# Patient Record
Sex: Female | Born: 1980 | Race: Black or African American | Hispanic: No | Marital: Single | Smoking: Former smoker
Health system: Southern US, Community
[De-identification: ages and names within clinical notes are randomized; demographics above are authoritative.]

## PROBLEM LIST (undated history)

## (undated) DIAGNOSIS — B169 Acute hepatitis B without delta-agent and without hepatic coma: Secondary | ICD-10-CM

## (undated) HISTORY — PX: OTHER SURGICAL HISTORY: SHX169

---

## 2002-09-25 ENCOUNTER — Other Ambulatory Visit: Admission: RE | Admit: 2002-09-25 | Discharge: 2002-09-25 | Payer: Self-pay | Admitting: *Deleted

## 2002-12-12 ENCOUNTER — Inpatient Hospital Stay (HOSPITAL_COMMUNITY): Admission: AD | Admit: 2002-12-12 | Discharge: 2002-12-15 | Payer: Self-pay | Admitting: *Deleted

## 2012-05-22 ENCOUNTER — Encounter (HOSPITAL_COMMUNITY): Payer: Self-pay | Admitting: Physical Medicine and Rehabilitation

## 2012-05-22 ENCOUNTER — Observation Stay (HOSPITAL_COMMUNITY): Payer: Medicaid Other

## 2012-05-22 ENCOUNTER — Encounter (HOSPITAL_COMMUNITY): Payer: Self-pay | Admitting: *Deleted

## 2012-05-22 ENCOUNTER — Inpatient Hospital Stay (HOSPITAL_COMMUNITY)
Admission: EM | Admit: 2012-05-22 | Discharge: 2012-05-24 | DRG: 443 | Disposition: A | Discharge status: Home / Self Care | Payer: Medicaid Other | Attending: Internal Medicine | Admitting: Internal Medicine

## 2012-05-22 ENCOUNTER — Emergency Department (INDEPENDENT_AMBULATORY_CARE_PROVIDER_SITE_OTHER)
Admission: EM | Admit: 2012-05-22 | Discharge: 2012-05-22 | Disposition: A | Discharge status: Nursing Home (Includes ICF) | Payer: Self-pay | Source: Home / Self Care | Attending: Family Medicine | Admitting: Family Medicine

## 2012-05-22 DIAGNOSIS — E876 Hypokalemia: Secondary | ICD-10-CM

## 2012-05-22 DIAGNOSIS — R17 Unspecified jaundice: Secondary | ICD-10-CM | POA: Diagnosis present

## 2012-05-22 DIAGNOSIS — D692 Other nonthrombocytopenic purpura: Secondary | ICD-10-CM

## 2012-05-22 DIAGNOSIS — R7401 Elevation of levels of liver transaminase levels: Secondary | ICD-10-CM | POA: Diagnosis present

## 2012-05-22 DIAGNOSIS — R748 Abnormal levels of other serum enzymes: Secondary | ICD-10-CM

## 2012-05-22 DIAGNOSIS — F17200 Nicotine dependence, unspecified, uncomplicated: Secondary | ICD-10-CM | POA: Diagnosis present

## 2012-05-22 DIAGNOSIS — E669 Obesity, unspecified: Secondary | ICD-10-CM | POA: Diagnosis present

## 2012-05-22 DIAGNOSIS — B1910 Unspecified viral hepatitis B without hepatic coma: Principal | ICD-10-CM | POA: Diagnosis present

## 2012-05-22 DIAGNOSIS — B169 Acute hepatitis B without delta-agent and without hepatic coma: Secondary | ICD-10-CM | POA: Diagnosis present

## 2012-05-22 DIAGNOSIS — K7689 Other specified diseases of liver: Secondary | ICD-10-CM | POA: Diagnosis present

## 2012-05-22 HISTORY — DX: Acute hepatitis B without delta-agent and without hepatic coma: B16.9

## 2012-05-22 LAB — URINALYSIS, ROUTINE W REFLEX MICROSCOPIC
Glucose, UA: NEGATIVE mg/dL
Hgb urine dipstick: NEGATIVE
Ketones, ur: NEGATIVE mg/dL
Leukocytes, UA: NEGATIVE
Nitrite: NEGATIVE
Protein, ur: NEGATIVE mg/dL
Specific Gravity, Urine: 1.003 — ABNORMAL LOW (ref 1.005–1.030)
Urobilinogen, UA: 0.2 mg/dL (ref 0.0–1.0)
pH: 6 (ref 5.0–8.0)

## 2012-05-22 LAB — COMPREHENSIVE METABOLIC PANEL
Albumin: 3.4 g/dL — ABNORMAL LOW (ref 3.5–5.2)
Alkaline Phosphatase: 213 U/L — ABNORMAL HIGH (ref 39–117)
BUN: 7 mg/dL (ref 6–23)
Chloride: 96 mEq/L (ref 96–112)
Glucose, Bld: 95 mg/dL (ref 70–99)
Potassium: 2.8 mEq/L — ABNORMAL LOW (ref 3.5–5.1)
Total Bilirubin: 14.8 mg/dL — ABNORMAL HIGH (ref 0.3–1.2)

## 2012-05-22 LAB — POCT URINALYSIS DIP (DEVICE)
Glucose, UA: 100 mg/dL — AB
Ketones, ur: NEGATIVE mg/dL
Specific Gravity, Urine: <=1.005 (ref 1.005–1.030)

## 2012-05-22 LAB — CBC WITH DIFFERENTIAL/PLATELET
Basophils Relative: 2 % — ABNORMAL HIGH (ref 0–1)
Hemoglobin: 13.2 g/dL (ref 12.0–15.0)
Lymphocytes Relative: 26 % (ref 12–46)
Lymphs Abs: 2 10*3/uL (ref 0.7–4.0)
Monocytes Relative: 7 % (ref 3–12)
Neutro Abs: 4.5 10*3/uL (ref 1.7–7.7)
Neutrophils Relative %: 59 % (ref 43–77)
RBC: 4.56 MIL/uL (ref 3.87–5.11)
WBC: 7.6 10*3/uL (ref 4.0–10.5)

## 2012-05-22 LAB — HEPATIC FUNCTION PANEL
Alkaline Phosphatase: 194 U/L — ABNORMAL HIGH (ref 39–117)
Indirect Bilirubin: 3.3 mg/dL — ABNORMAL HIGH (ref 0.3–0.9)
Total Bilirubin: 14.7 mg/dL — ABNORMAL HIGH (ref 0.3–1.2)

## 2012-05-22 LAB — POCT PREGNANCY, URINE: Preg Test, Ur: NEGATIVE

## 2012-05-22 LAB — CBC
MCH: 28.8 pg (ref 26.0–34.0)
MCHC: 36.3 g/dL — ABNORMAL HIGH (ref 30.0–36.0)
Platelets: 283 10*3/uL (ref 150–400)
RDW: 14 % (ref 11.5–15.5)

## 2012-05-22 LAB — PROTIME-INR: Prothrombin Time: 14.7 seconds (ref 11.6–15.2)

## 2012-05-22 LAB — CREATININE, SERUM: Creatinine, Ser: 0.74 mg/dL (ref 0.50–1.10)

## 2012-05-22 LAB — LIPASE, BLOOD: Lipase: 61 U/L — ABNORMAL HIGH (ref 11–59)

## 2012-05-22 MED ORDER — IBUPROFEN 400 MG PO TABS
400.0000 mg | ORAL_TABLET | Freq: Four times a day (QID) | ORAL | Status: DC | PRN
  Filled 2012-05-22: qty 1

## 2012-05-22 MED ORDER — SODIUM CHLORIDE 0.9 % IV SOLN: INTRAVENOUS | Status: AC

## 2012-05-22 MED ORDER — BISACODYL 5 MG PO TBEC: 10.0000 mg | DELAYED_RELEASE_TABLET | Freq: Every day | ORAL | Status: DC | PRN

## 2012-05-22 MED ORDER — SENNOSIDES-DOCUSATE SODIUM 8.6-50 MG PO TABS: 1.0000 | ORAL_TABLET | Freq: Every evening | ORAL | Status: DC | PRN

## 2012-05-22 MED ORDER — POTASSIUM CHLORIDE CRYS ER 20 MEQ PO TBCR
40.0000 meq | EXTENDED_RELEASE_TABLET | Freq: Once | ORAL | Status: AC
  Administered 2012-05-22: 40 meq via ORAL
  Filled 2012-05-22: qty 2

## 2012-05-22 MED ORDER — ALUM & MAG HYDROXIDE-SIMETH 200-200-20 MG/5ML PO SUSP: 30.0000 mL | Freq: Four times a day (QID) | ORAL | Status: DC | PRN

## 2012-05-22 MED ORDER — ENOXAPARIN SODIUM 40 MG/0.4ML ~~LOC~~ SOLN
40.0000 mg | SUBCUTANEOUS | Status: DC
  Administered 2012-05-22 – 2012-05-23 (×2): 40 mg via SUBCUTANEOUS
  Filled 2012-05-22 (×3): qty 0.4

## 2012-05-22 MED ORDER — SODIUM CHLORIDE 0.9 % IV SOLN
INTRAVENOUS | Status: AC
  Administered 2012-05-22: 23:00:00 via INTRAVENOUS

## 2012-05-22 MED ORDER — ONDANSETRON HCL 4 MG/2ML IJ SOLN: 4.0000 mg | Freq: Three times a day (TID) | INTRAMUSCULAR | Status: AC | PRN

## 2012-05-22 MED ORDER — ONDANSETRON HCL 4 MG PO TABS: 4.0000 mg | ORAL_TABLET | Freq: Four times a day (QID) | ORAL | Status: DC | PRN

## 2012-05-22 MED ORDER — ONDANSETRON HCL 4 MG/2ML IJ SOLN: 4.0000 mg | Freq: Four times a day (QID) | INTRAMUSCULAR | Status: DC | PRN

## 2012-05-22 NOTE — ED Notes (Signed)
Pt returned from ultrasound

## 2012-05-22 NOTE — H&P (Signed)
[facility] History and Physical  Robin Marks WUJ:[id number] DOB: February 26, 1981 DOA: 05/22/2012  Referring physician: Dr Radford Pax PCP: No primary provider on file.  Specialists: GI pending, [place],  Chief Complaint: Yellow eyes, dark urine  HPI: Robin Marks is a 31 y.o. female with no significant PMHx who presents to ED with a tree day history of yellow eyes and dark urine. Patient states that approximately 9 days prior to admission felt she was bitten by a spider on her left upper extremity which has since resolved and on the lips. Patient stated that 3 days prior to admission noticed that her eyes were yellow in the urine was dark the patient denied any itching no swelling. Patient denies any fevers, no chest pain, no shortness of breath, no abdominal pain, no emesis, no diarrhea, no constipation. Patient does endorse some chills some nausea and generalized weakness as well as decreased appetite. Patient denies any IV drug use. Patient denies any alcohol dependence. Patient denies any ingestion of Tylenol. Patient states use ibuprofen as needed. Patient is sexually active with men and states she practices safe sex. Patient initially had presented to the urgent care Center and was noted to be jaundiced and sent to the ED. In the ED comprehensive metabolic profile which was obtained came back with a transaminitis with a total bilirubin of 14.8. AST of 07/12/2003, ALT of 08/11/1999, lipase of 61. Magnesium of 1.9. Potassium of 2.8. Sodium of 134. CBC which was obtained at a white count of 7.6 hemoglobin of 13.2 platelet of 283. Urinalysis which was done showed large bilirubin in the urine. EKG showed a normal sinus rhythm. Urine pregnancy test was negative. Abdominal ultrasound is pending. Will call to admit the patient for further evaluation and management.  Review of Systems: The patient denies anorexia, fever, weight loss,, vision loss, decreased hearing, hoarseness, chest pain, syncope,  dyspnea on exertion, peripheral edema, balance deficits, hemoptysis, abdominal pain, melena, hematochezia, severe indigestion/heartburn, hematuria, incontinence, genital sores, muscle weakness, suspicious skin lesions, transient blindness, difficulty walking, depression, unusual weight change, abnormal bleeding, enlarged lymph nodes, angioedema, and breast masses.   History reviewed. No pertinent past medical history. Past Surgical History  Procedure Date  . Cesarean section    Social History:  reports that she has been smoking Cigars.  She does not have any smokeless tobacco history on file. She reports that she drinks alcohol. She reports that she does not use illicit drugs.  No Known Allergies  History reviewed. No pertinent family history.   Prior to Admission medications   Medication Sig Start Date End Date Taking? Authorizing Provider  ibuprofen (ADVIL,MOTRIN) 200 MG tablet Take 200 mg by mouth every 6 (six) hours as needed. For pain   Yes Historical Provider, MD   Physical Exam: Filed Vitals:   05/22/12 1611 05/22/12 1658 05/22/12 1759  BP: 119/87 115/76 129/92  Pulse: 91 95 98  Temp: 98.9 F (37.2 C)    TempSrc: Oral    Resp: 20 16 20   SpO2: 100% 100% 98%     General:  Well-developed well-nourished in no acute cardiopulmonary distress.  Eyes: Pupils equal round and reactive to light and accommodation. Extraocular movements intact. Sclera is icteric.  ENT: Oropharynx is clear, no lesions, no exudates.  Neck: Supple with no lymphadenopathy.  Cardiovascular: Regular rate rhythm no murmurs rubs or gallops. No JVD. No lower extremity edema.  Respiratory: Clear to sedation bilaterally. No wheezes, no crackles, no rhonchi.  Abdomen: Soft, nontender, nondistended, positive bowel sounds.  Skin: No lesions, no rashes  Musculoskeletal: 5 out of 5 bilateral upper extremity strength. 5 bilateral lower extremity strength.  Psychiatric: Normal mood. Normal affect. Good  insight. Good Judgment.  Neurologic: Alert and oriented x3. CN 2 through 12 are grossly intact. No focal deficits.  Labs on Admission:  Basic Metabolic Panel:  Lab 05/22/12 9604  NA 134*  K 2.8*  CL 96  CO2 27  GLUCOSE 95  BUN 7  CREATININE 0.78  CALCIUM 9.2  MG 1.9  PHOS --   Liver Function Tests:  Lab 05/22/12 1604  AST 1905*  ALT 2801*  ALKPHOS 213*  BILITOT 14.8*  PROT 7.8  ALBUMIN 3.4*    Lab 05/22/12 1604  LIPASE 61*  AMYLASE --   No results found for this basename: AMMONIA:5 in the last 168 hours CBC:  Lab 05/22/12 1604  WBC 7.6  NEUTROABS 4.5  HGB 13.2  HCT 35.9*  MCV 78.7  PLT 283   Cardiac Enzymes: No results found for this basename: CKTOTAL:5,CKMB:5,CKMBINDEX:5,TROPONINI:5 in the last 168 hours  BNP (last 3 results) No results found for this basename: PROBNP:3 in the last 8760 hours CBG: No results found for this basename: GLUCAP:5 in the last 168 hours  Radiological Exams on Admission: US Abdomen Complete  05/22/2012  *RADIOLOGY REPORT*  Clinical Data:  Elevated liver enzymes.  ABDOMINAL ULTRASOUND COMPLETE  Comparison:  None.  Findings:  Gallbladder:  No gallstones, gallbladder wall thickening, or pericholecystic fluid.  Common Bile Duct:  Within normal limits in caliber.  Liver: No focal mass.  Diffusely increased in echogenicity most compatible with diffuse hepatic steatosis.  IVC:  Appears normal.  Pancreas:  Obscured by overlying bowel gas.  Spleen:  Within normal limits in size and echotexture.  Right kidney:  No hydronephrosis or mass.  Normal in size and echogenicity.  Benign appearing 1.9 cm cyst in the upper pole.  Left kidney:  Normal in size and parenchymal echogenicity.  No evidence of mass or hydronephrosis.  Abdominal Aorta:  No aneurysm identified.  IMPRESSION: Increased echogenicity throughout the liver most consistent with diffuse hepatic steatosis.  Simple right renal cyst.  Pancreas was obscured by overlying bowel gas.   Original  Report Authenticated By: Jolaine Click, M.D.     EKG: NSR  Assessment/Plan Principal Problem:  *Jaundice Active Problems:  Transaminitis  Hypokalemia   #1 Painless jaundice Questionable etiology. Differential includes an acute viral hepatitis versus an autoimmune hepatitis versus primary biliary cirrhosis versus primary sclerosing cholangitis however patient with no pruritus or itching. Patient denies any Tylenol ingestion. Patient with no history of alcohol abuse. Patient with no recent new medications. Abdominal ultrasound with increased echogenicity throughout the liver consistent with diffuse hepatic steatosis. Pancreas is obscured. Common bile duct was within normal limits. Will admit the patient to MedSurg floor. Check an acute hepatitis panel. Check total IgG, ANA, antimitochondrial antibody, anti-smooth antibody, HIV, PT/INR. We'll consult with gastroenterology for further evaluation and management. Spoke with Dr. Rhea Belton on the telephone.  #2 transaminitis Questionable etiology. See problem #1.  #3 hypokalemia Replete.  #4 prophylaxis Lovenox for DVT prophylaxis.   Code Status: Full Family Communication: Updated patient. No family at bedside. Disposition Plan: Admit to inpatient.  Time spent: 60 mins  [facility] Pager [phone]  If 7PM-7AM, please contact night-coverage [email or web address] Password [email or web address] 05/22/2012, 8:22 PM

## 2012-05-22 NOTE — ED Provider Notes (Signed)
History     CSN: [id number]  Arrival date & time 05/22/12  1129   First MD Initiated Contact with Patient 05/22/12 1248      Chief Complaint  Patient presents with  . Urinary Frequency    (Consider location/radiation/quality/duration/timing/severity/associated sxs/prior treatment) HPI Comments: 31 year old female with history of low hemoglobin as per her report. Here complaining of yellow discoloration of sclerae, dark-colored urine for 4 days. Symptoms associated with subjective fever, nausea and mild abdominal discomfort, general malaise and skin rash. Denies vomiting. Patient reports normal stools. Patient works in a daycare. No prior history of hepatitis. No history of pancreatitis or recent alcohol intake. No known sick contacts. Patient also noted a red patch in the skin of her left inner arm few days ago and she thinks that she was bitten by a spider or an insect. Has had red spots in her anterior torso.   History reviewed. No pertinent past medical history.  Past Surgical History  Procedure Date  . Cesarean section     No family history on file.  History  Substance Use Topics  . Smoking status: Current Every Day Smoker  . Smokeless tobacco: Not on file  . Alcohol Use: Yes    OB History    Grav Para Term Preterm Abortions TAB SAB Ect Mult Living                  Review of Systems  Constitutional: Positive for fever, activity change, appetite change and fatigue. Negative for chills and diaphoresis.  HENT: Negative for congestion and sore throat.   Eyes: Negative for visual disturbance.  Gastrointestinal: Positive for nausea and abdominal pain. Negative for vomiting, diarrhea, constipation and abdominal distention.  Skin: Positive for rash.  Neurological: Negative for dizziness and headaches.  All other systems reviewed and are negative.    Allergies  Review of patient's allergies indicates not on file.  Home Medications  No current outpatient  prescriptions on file.  BP 129/89  Pulse 78  Temp 99 F (37.2 C) (Oral)  Resp 18  SpO2 97%  Physical Exam  Nursing note and vitals reviewed. Constitutional: She is oriented to person, place, and time. She appears well-developed and well-nourished. No distress.  HENT:  Head: Normocephalic and atraumatic.  Mouth/Throat: Oropharynx is clear and moist. No oropharyngeal exudate.       Sublingual mucosal jaundice  Eyes: Conjunctivae normal and EOM are normal. Pupils are equal, round, and reactive to light. Right eye exhibits no discharge. Left eye exhibits no discharge. Scleral icterus is present.       Mild to moderate bilateral scleral jaundice.  Neck: Neck supple. No thyromegaly present.  Cardiovascular: Normal rate, regular rhythm and normal heart sounds.  Exam reveals no gallop and no friction rub.   No murmur heard. Pulmonary/Chest: Effort normal and breath sounds normal. No respiratory distress. She has no wheezes. She has no rales. She exhibits no tenderness.  Abdominal: Soft. Bowel sounds are normal. She exhibits no distension and no mass. There is no rebound and no guarding.       Mild epigastric tenderness.  No hepatosplenomegaly.  Lymphadenopathy:    She has no cervical adenopathy.  Neurological: She is alert and oriented to person, place, and time.  Skin: She is not diaphoretic.       Purpuric petechial rash in the anterior upper torso    ED Course  Procedures (including critical care time)  Labs Reviewed  POCT URINALYSIS DIP (DEVICE) - Abnormal; Notable for  the following:    Glucose, UA 100 (*)     Bilirubin Urine LARGE (*)     All other components within normal limits  POCT PREGNANCY, URINE   No results found.   1. Jaundice   2. Purpura       MDM  31 year old smoker female with history of low hemoglobin as per her report. Here complaining of yellow discoloration of sclerae, dark-colored urine for 4 days. Symptoms associated with subjective fever, nausea,  abdominal discomfort, general malaise and skin rash. Patient works in a daycare. On examination: Vital signs are normal. Patient has a scleral jaundice, petechiae in upper torso skin. No obvious hepatosplenomegaly. Negative Murphy sign. No anasarca. Bilirubinuria in point-of-care urine. Concerned for acute hepatitis versus hemoglobinopathy/purpura versus underlying liver/cholestasis disease. Decided to transfer to the emergency department for further evaluation and management. No blood or other test has been performed at the urgent care prior to transfer to the emergency department.        Sharin Grave, MD 05/22/12 1401

## 2012-05-22 NOTE — ED Provider Notes (Signed)
I saw and evaluated the patient, reviewed the resident's note and I agree with the findings and plan.   .Face to face Exam:  General:  Awake HEENT:  Atraumatic Resp:  Normal effort Abd:  Nondistended Neuro:No focal weakness Lymph: No adenopathy   Nelia Shi, MD 05/22/12 1825

## 2012-05-22 NOTE — ED Notes (Signed)
Pt in ultrasound

## 2012-05-22 NOTE — ED Notes (Signed)
Pt  Has  Some  Jaundice  To  Her  Eyes  As  Well

## 2012-05-22 NOTE — ED Notes (Signed)
Pt presents to department from [facility] for evaluation of dark colored urine, yellow sclera and loss of appetite. Onset Sunday. Denies dysuria. No nausea/vomiting. Denies pain at the time. She is conscious alert and oriented x4.

## 2012-05-22 NOTE — ED Notes (Addendum)
Pt presents with with an insect bite on right inner arm that she noticed on 11/11, describes it as being red in color and turning black.  Pt then noticed swelling of her upper lip on 11/15.  Pt has been feeling fatigued for the past week.  Eyes yellow in color at present, pt denies this to be normal.  Pt also states she noticed her urine was darker in color this week.  Complaining of fatigue for the past week.  Denies any pain at this time.

## 2012-05-22 NOTE — ED Notes (Signed)
Pt   Reports symptoms  Frequent      Urination  With       Dark    Color    As  Well  Symptoms  X  4  Days  She  Also  Reports  A  Bump on  Upper  Lip and     Dark  Spot on  r  Arm

## 2012-05-22 NOTE — ED Provider Notes (Signed)
History     CSN: [id number]  Arrival date & time 05/22/12  1427   First MD Initiated Contact with Patient 05/22/12 1549      Chief Complaint  Patient presents with  . Fatigue  . Insect Bite    (Consider location/radiation/quality/duration/timing/severity/associated sxs/prior treatment) HPI 31 year old female presents here after being seen at urgent care with concern for jaundice. Patient states past 4 days she has noticed yellowing of her eyes along with dark urine. This problem has gradually gotten worse. She states it is associated with general malaise. Her current pain level is 0/10 intensity. Patient states she has never had this problem before. She says that [place] and was bitten by an insect last week. Showed some localized erythema on her left arm but that is now gone. Patient works in a daycare. She reports normal stools. She denies any family history of jaundice. She denies any recent Tylenol ingestion. States she is taking about 8 ibuprofen over the past several days. She has no known sick contacts. No recent alcohol ingestion. Patient had some nausea but no vomiting. No past medical history on file.  Past Surgical History  Procedure Date  . Cesarean section     No family history on file.  History  Substance Use Topics  . Smoking status: Current Every Day Smoker    Types: Cigarettes  . Smokeless tobacco: Not on file  . Alcohol Use: Yes    OB History    Grav Para Term Preterm Abortions TAB SAB Ect Mult Living                  Review of Systems Constitutional: Negative for fever.  Eyes: Negative for vision loss.  ENT: Negative for difficulty swallowing.  Cardiovascular: Negative for chest pain. Respiratory: Negative for respiratory distress.  Gastrointestinal:  Negative for vomiting.  Genitourinary: Negative for inability to void.  Musculoskeletal: Negative for gait problem.  Integumentary: Positive for rash.  Neurological:  Negative for new focal weakness.     Allergies  Review of patient's allergies indicates no known allergies.  Home Medications   Current Outpatient Rx  Name  Route  Sig  Dispense  Refill  . IBUPROFEN 200 MG PO TABS   Oral   Take 200 mg by mouth every 6 (six) hours as needed. For pain           BP 129/92  Pulse 98  Temp 98.9 F (37.2 C) (Oral)  Resp 20  SpO2 98%  Physical Exam Nursing note and vitals reviewed.  Constitutional: Pt is alert and appears stated age. Eyes: No injection, scleral icterus. HENT: Atraumatic, airway open without erythema or exudate.  Respiratory: No respiratory distress. Equal breathing bilaterally. Cardiovascular: Normal rate. Extremities warm and well perfused.  Abdomen: Soft, non-tender. No RUQ tenderness. No murphy's sign. No rebound, no guarding.  MSK: Extremities are atraumatic without deformity. Skin: Rash to , no wounds.   Neuro: No motor nor sensory deficit.     ED Course  Procedures (including critical care time)  Labs Reviewed  URINALYSIS, ROUTINE W REFLEX MICROSCOPIC - Abnormal; Notable for the following:    Color, Urine AMBER (*)  BIOCHEMICALS MAY BE AFFECTED BY COLOR   Specific Gravity, Urine 1.003 (*)     Bilirubin Urine LARGE (*)     All other components within normal limits  CBC WITH DIFFERENTIAL - Abnormal; Notable for the following:    HCT 35.9 (*)     MCHC  36.8 (*)     Eosinophils Relative 6 (*)     Basophils Relative 2 (*)     Basophils Absolute 0.2 (*)     All other components within normal limits  COMPREHENSIVE METABOLIC PANEL - Abnormal; Notable for the following:    Sodium 134 (*)     Potassium 2.8 (*)     Albumin 3.4 (*)     AST 1905 (*)     ALT 2801 (*)     Alkaline Phosphatase 213 (*)     Total Bilirubin 14.8 (*)     All other components within normal limits  LIPASE, BLOOD - Abnormal; Notable for the following:    Lipase 61 (*)     All other components within normal limits  HEPATITIS PANEL, ACUTE    MAGNESIUM   No results found.   1. Hypokalemia   2. Abnormal liver enzymes       MDM  31 y.o. female w/ PMHx of tobacco use, c section presents w/ jaundice, dark urine, general malaise. Here NAD, vitals okay. Plan for work up.   Initial labs show hypokalemia, elevate liver enzymes mostly hepatocellular pattern with marked increase in bilirubin. CBC with no anemia. UA with bili, no signs of infection. Discussed with patient and recommended admission for monitoring,further evaluation. Pt agreeable. Ordered EKG, PO Kcl, hepatitis panel, lipase, abd [place]. Called hospitalist for admission.     I independently viewed, interpreted, and used in my medical decision making all ordered lab and imaging tests. Medical Decision Making discussed with ED attending Nelia Shi, MD          Charm Barges, MD 05/22/12 Rickey Primus

## 2012-05-23 DIAGNOSIS — R17 Unspecified jaundice: Secondary | ICD-10-CM

## 2012-05-23 DIAGNOSIS — R7402 Elevation of levels of lactic acid dehydrogenase (LDH): Secondary | ICD-10-CM

## 2012-05-23 DIAGNOSIS — R748 Abnormal levels of other serum enzymes: Secondary | ICD-10-CM

## 2012-05-23 LAB — CBC
MCV: 78.1 fL (ref 78.0–100.0)
Platelets: 281 10*3/uL (ref 150–400)
RBC: 4.15 MIL/uL (ref 3.87–5.11)
WBC: 7.2 10*3/uL (ref 4.0–10.5)

## 2012-05-23 LAB — HEPATIC FUNCTION PANEL
Bilirubin, Direct: 11.7 mg/dL — ABNORMAL HIGH (ref 0.0–0.3)
Indirect Bilirubin: 3.2 mg/dL — ABNORMAL HIGH (ref 0.3–0.9)

## 2012-05-23 LAB — HIV ANTIBODY (ROUTINE TESTING W REFLEX): HIV: NONREACTIVE

## 2012-05-23 LAB — BASIC METABOLIC PANEL
CO2: 22 mEq/L (ref 19–32)
Calcium: 8.9 mg/dL (ref 8.4–10.5)
GFR calc non Af Amer: >90 mL/min (ref >90)
Sodium: 135 mEq/L (ref 135–145)

## 2012-05-23 LAB — HEPATITIS PANEL, ACUTE: Hepatitis B Surface Ag: POSITIVE — AB

## 2012-05-23 LAB — ANTI-SMOOTH MUSCLE ANTIBODY, IGG: F-Actin IgG: 31 U — ABNORMAL HIGH (ref <20)

## 2012-05-23 LAB — PROTIME-INR: Prothrombin Time: 14.7 seconds (ref 11.6–15.2)

## 2012-05-23 LAB — APTT: aPTT: 38 seconds — ABNORMAL HIGH (ref 24–37)

## 2012-05-23 NOTE — Consult Note (Signed)
Gastroenterology Consult: 11:45 AM 05/23/2012   Referring Provider: Ramiro Harvest MD  Primary Care Physician:   Primary Gastroenterologist:  None.    Reason for Consultation:  Jaundice.   HPI: Robin Marks is a 31 y.o. female.  Lives in a ground floor apartment that abuts wooded lot, sees a lot of insects indoors.  On 8/11 saw a small bruise that looke like an insect bite on her left, inner arm, just above the elbow.  It was not sore, did not itch or bleed or ulcerate, it enlarged to about a dollar coin size and resolved within 6 days.  On 11/15 a similar bruise showed up on the upper, left side of lip.  It has mostly resolved.  Appetite decreased along with decline in endurance and general malaise starting 11/14 but no nausea, abdominal pain.  By Sunday, the 17th, she noticed scleral icterus, on the 17th this was worse and her urine got dark.  Stools were pale starting today.  She had sense of fever with mild sweats a few nights ago.  Additional rash of slightly raised purplish lesions erupted across her upper chest late last week, it has mostly resolved but the area is now mild to moderately pruritic.   Went to Urgent Care and sent to ED with jaundice.  Bilirubin measured at 14.8, AST/ALT 1798/21/96, alk phos 194.  coags not elevated.  Creatinine 0.7, BUN normal. Lipase is 90.  No elevation of WBCs, no anemia.  Diffuse hepatic steatosis on ultrasound but ducts and GB unremarkable. Labs for viral hepatitis and autoimmune markers are in process.  So far ANA is positive and IgG is elevated.   She drinks up to 5 shots of liquor on Saturday evenings, but no etoh in past 2 weekends and no etoh during the week.  Practices safe sex, using condoms.  She has had no unusual bleeding.   Family hx strongly positive for thyroid dz:  Hypo and hyper thyroidism, goiter variably affecting sister, mom and mat aunt.   She denies any pain of abdomen or joints.  She  gets occasional headaches, these have not accelerated.  Treats these with 400 mg Ibuprofen, used 1200 mg last week over several days. Has not used Acetominophen.  No nutritional, weight loss or performance enhancing type supplement use.   Hard for her to lose weight.  She exercised and watches her diet, cooks the bulk of her food from raw materials, but does eat occasional fast food.   Past Medical History  Diagnosis Date  . No pertinent past medical history     Past Surgical History  Procedure Date  . Cesarean section     Prior to Admission medications   Medication Sig Start Date End Date Taking? Authorizing Provider  ibuprofen (ADVIL,MOTRIN) 200 MG tablet Take 200 mg by mouth every 6 (six) hours as needed. For pain   Yes Historical Provider, MD    Scheduled Meds: Lovenox   Infusions:    . sodium chloride     PRN Meds: alum & mag hydroxide-simeth, bisacodyl, ibuprofen, [EXPIRED] ondansetron (ZOFRAN) IV, ondansetron, senna-docusate,   Allergies as of 05/22/2012  . (No Known Allergies)    History reviewed. No pertinent family history.  History   Social History  . Marital Status: Single    Spouse Name: N/A    Number of Children: N/A  . Years of Education: N/A   Occupational History  . Pre K teacher   Social History Main Topics  . Smoking status:  Current Every Day Smoker    Types: Cigars  . Smokeless tobacco: Not on file  . Alcohol Use: Yes     Comment: drinks on weekends   . Drug Use: No  . Sexually Active: Yes    Birth Control/ Protection: None   Social History Narrative  . Close family.     REVIEW OF SYSTEMS: Constitutional:  No weight loss ENT:  No hearing problems or tinitus Pulm:  No cough or sor CV:  No palps, chest pain or LE edema GU:  See hpi GI:  See hpi Heme:  No anemia.    Transfusions:  None ever Neuro:  See hpi Derm:  See hpi Endocrine:  No hx of abnormal blood sugars, no gestational diabetes.  Immunization:  Tdap up to date, no flu  shot this year Travel:  none   PHYSICAL EXAM: Vital signs in last 24 hours: Temp:  [97.7 F (36.5 C)-99 F (37.2 C)] 98 F (36.7 C) (11/21 0922) Pulse Rate:  [78-98] 96  (11/21 0922) Resp:  [16-20] 20  (11/21 0922) BP: (109-129)/(71-92) 116/81 mmHg (11/21 0922) SpO2:  [97 %-100 %] 99 % (11/21 0922) Weight:  [75.297 kg (166 lb)-77.9 kg (171 lb 11.8 oz)] 77.9 kg (171 lb 11.8 oz) (11/21 0600)  General: looks well, overweight, scleral icterus Head:  No swelling or asymmetry  Eyes:  Icteric, no pallor Ears:  Not HOH  Nose:  No discharge Mouth:  Good teeth, moist , clear MM.  Yellowing on underside of tongue.  Neck:  No mass or JVD Lungs:  Clear.  No cough, no dyspnea Heart: RRR.  No MRG Abdomen:  Soft, obese (mild), not tender or distended,  No hepatosplenomegaly, no mass.  Active BS.   Rectal: not done   Musc/Skeltl: no gross joint deformity Extremities:  No pedal edema  Neurologic:  Oriented x 3.  Excellent historian, intelligent.  No tremor, no asterixis.  Skin:  A few flat sub centimeter hyperemic lesions on chest.  i do not appreciate any residual lesions on the kip or left arm. Tattoos:  none Nodes:  No cervical adenopathy.   Psych:  Pleasant, relaxed, cooperative.   Intake/Output from previous day: 11/20 0701 - 11/21 0700 In: 0  Out: 3 [Urine:3] Intake/Output this shift: Total I/O In: 120 [P.O.:120] Out: 300 [Urine:300]  LAB RESULTS:  Basename 05/23/12 0735 05/22/12 2151 05/22/12 1604  WBC 7.2 7.9 7.6  HGB 11.7* 12.6 13.2  HCT 32.4* 34.7* 35.9*  PLT 281 283 283   BMET Lab Results  Component Value Date   NA 135 05/23/2012   NA 134* 05/22/2012   K 3.8 05/23/2012   K 2.8* 05/22/2012   CL 102 05/23/2012   CL 96 05/22/2012   CO2 22 05/23/2012   CO2 27 05/22/2012   GLUCOSE 93 05/23/2012   GLUCOSE 95 05/22/2012   BUN 6 05/23/2012   BUN 7 05/22/2012   CREATININE 0.69 05/23/2012   CREATININE 0.74 05/22/2012   CREATININE 0.78 05/22/2012   CALCIUM 8.9  05/23/2012   CALCIUM 9.2 05/22/2012   LFT  Basename 05/23/12 0735 05/22/12 2151 05/22/12 1604  PROT 7.0 7.1 7.8  ALBUMIN 3.0* 3.2* 3.4*  AST 1591* 1798* 1905*  ALT 2343* 2196* 2801*  ALKPHOS 183* 194* 213*  BILITOT 14.9* 14.7* 14.8*  BILIDIR 11.7* 11.4* --  IBILI 3.2* 3.3* --   PT/INR Lab Results  Component Value Date   INR 1.17 05/23/2012   INR 1.17 05/22/2012   Hepatitis Panel Pending  Ref. Range 05/22/2012 20:22  ANA Latest Range: NEGATIVE  POSITIVE (A)  IgG (Immunoglobin G), Serum Latest Range: 470-131-5160 mg/dL 1610 (H)    Drugs of Abuse  No results found for this basename: labopia,  cocainscrnur,  labbenz,  amphetmu,  thcu,  labbarb     RADIOLOGY STUDIES: US Abdomen Complete 05/22/2012  *RADIOLOGY REPORT*  Clinical Data:  Elevated liver enzymes.  ABDOMINAL ULTRASOUND COMPLETE  Comparison:  None.  Findings:  Gallbladder:  No gallstones, gallbladder wall thickening, or pericholecystic fluid.  Common Bile Duct:  Within normal limits in caliber.  Liver: No focal mass.  Diffusely increased in echogenicity most compatible with diffuse hepatic steatosis.  IVC:  Appears normal.  Pancreas:  Obscured by overlying bowel gas.  Spleen:  Within normal limits in size and echotexture.  Right kidney:  No hydronephrosis or mass.  Normal in size and echogenicity.  Benign appearing 1.9 cm cyst in the upper pole.  Left kidney:  Normal in size and parenchymal echogenicity.  No evidence of mass or hydronephrosis.  Abdominal Aorta:  No aneurysm identified.  IMPRESSION: Increased echogenicity throughout the liver most consistent with diffuse hepatic steatosis.  Simple right renal cyst.  Pancreas was obscured by overlying bowel gas.   Original Report Authenticated By: Jolaine Click, M.D.     ENDOSCOPIC STUDIES: None ever  IMPRESSION: *  Acute hepatitis. ANA is elevated.  r/o viral hepatitis, r/o insect borne illness, r/o autoimmune hepatitis *  Fatty liver *  Obesity   PLAN: *  Await pending  labs.  Wonder if we should send studies for RMSF, EBV? *  Continue IVF (at 75 ml/hour) and regular diet.    LOS: 1 day   Jennye Moccasin  05/23/2012, 11:45 AM Pager: [phone]      ________________________________________________________________________  [facility] MD note:  I personally examined the patient, reviewed the data and agree with the assessment and plan described above.  Non obstructive jaundice.  Very elevated transaminases.  She has acute hepatitis of unclear etiology at this point. Hep C ab is neg but still awaiting Hep A and B studies. ANA is positive but titer if very low and so probably NOT AIH.  I have done quick literature search on spider bites and hepatitis and there does not appear to be any correlation.    For now, encourage PO intake, ambulation.  Awaiting futher tests, repeat LFTs in AM.   Rob Bunting, MD [facility] Gastroenterology Pager [phone]

## 2012-05-23 NOTE — Progress Notes (Signed)
[facility] PROGRESS NOTE  Robin Marks WUJ:[id number] DOB: 01/14/1981 DOA: 05/22/2012 PCP: No primary provider on file.  Assessment/Plan:  #1 jaundice Likely secondary to acute hepatitis versus autoimmune etiology. ANA slightly elevated. Acute hepatitis panel is pending. Abdominal ultrasound consistent with hepatic steatosis. Labs are pending. GI is consulted and appreciate input and recommendations. Follow LFTs.  #2 transaminitis Questionable etiology. See problem #1.  #3 hypokalemia Repleted.  #4 prophylaxis Lovenox for DVT prophylaxis.   Code Status: Full Family Communication: abdomen the patient no family at bedside. Disposition Plan: home when medically stable.   Consultants:  Gastroenterology: Dr. Christella Hartigan 05/23/2012  Procedures:  Abdominal ultrasound 05/22/2012  Antibiotics:  none  HPI/Subjective: Patient with no abdominal pain no nausea no vomiting no complaints.  Objective: Filed Vitals:   05/23/12 0600 05/23/12 0922 05/23/12 1522 05/23/12 1753  BP: 109/71 116/81 129/88 146/106  Pulse: 85 96 90 90  Temp: 97.7 F (36.5 C) 98 F (36.7 C) 98.7 F (37.1 C) 98 F (36.7 C)  TempSrc:      Resp: 18 20 20 20   Height:      Weight: 77.9 kg (171 lb 11.8 oz)     SpO2: 100% 99% 99% 100%    Intake/Output Summary (Last 24 hours) at 05/23/12 1903 Last data filed at 05/23/12 1754  Gross per 24 hour  Intake   1080 ml  Output    303 ml  Net    777 ml   Filed Weights   05/22/12 2205 05/23/12 0500 05/23/12 0600  Weight: 75.4 kg (166 lb 3.6 oz) 75.297 kg (166 lb) 77.9 kg (171 lb 11.8 oz)    Exam:   General:  NAD. Sclerae icteris  Cardiovascular: RRR  Respiratory: CTAB  Abdomen: Soft/NT/ND/+BS  Data Reviewed: Basic Metabolic Panel:  Lab 05/23/12 9562 05/22/12 2151 05/22/12 1604  NA 135 -- 134*  K 3.8 -- 2.8*  CL 102 -- 96  CO2 22 -- 27  GLUCOSE 93 -- 95  BUN 6 -- 7  CREATININE 0.69 0.74 0.78  CALCIUM 8.9 -- 9.2  MG -- -- 1.9  PHOS  -- -- --   Liver Function Tests:  Lab 05/23/12 0735 05/22/12 2151 05/22/12 1604  AST 1591* 1798* 1905*  ALT 2343* 2196* 2801*  ALKPHOS 183* 194* 213*  BILITOT 14.9* 14.7* 14.8*  PROT 7.0 7.1 7.8  ALBUMIN 3.0* 3.2* 3.4*    Lab 05/23/12 0735 05/22/12 1604  LIPASE 90* 61*  AMYLASE -- --   No results found for this basename: AMMONIA:5 in the last 168 hours CBC:  Lab 05/23/12 0735 05/22/12 2151 05/22/12 1604  WBC 7.2 7.9 7.6  NEUTROABS -- -- 4.5  HGB 11.7* 12.6 13.2  HCT 32.4* 34.7* 35.9*  MCV 78.1 79.2 78.7  PLT 281 283 283   Cardiac Enzymes: No results found for this basename: CKTOTAL:5,CKMB:5,CKMBINDEX:5,TROPONINI:5 in the last 168 hours BNP (last 3 results) No results found for this basename: PROBNP:3 in the last 8760 hours CBG: No results found for this basename: GLUCAP:5 in the last 168 hours  No results found for this or any previous visit (from the past 240 hour(s)).   Studies: US Abdomen Complete  05/22/2012  *RADIOLOGY REPORT*  Clinical Data:  Elevated liver enzymes.  ABDOMINAL ULTRASOUND COMPLETE  Comparison:  None.  Findings:  Gallbladder:  No gallstones, gallbladder wall thickening, or pericholecystic fluid.  Common Bile Duct:  Within normal limits in caliber.  Liver: No focal mass.  Diffusely increased in echogenicity most compatible with diffuse  hepatic steatosis.  IVC:  Appears normal.  Pancreas:  Obscured by overlying bowel gas.  Spleen:  Within normal limits in size and echotexture.  Right kidney:  No hydronephrosis or mass.  Normal in size and echogenicity.  Benign appearing 1.9 cm cyst in the upper pole.  Left kidney:  Normal in size and parenchymal echogenicity.  No evidence of mass or hydronephrosis.  Abdominal Aorta:  No aneurysm identified.  IMPRESSION: Increased echogenicity throughout the liver most consistent with diffuse hepatic steatosis.  Simple right renal cyst.  Pancreas was obscured by overlying bowel gas.   Original Report Authenticated By: Jolaine Click, M.D.     Scheduled Meds:   . [COMPLETED] sodium chloride   Intravenous STAT  . enoxaparin (LOVENOX) injection  40 mg Subcutaneous Q24H  . [COMPLETED] potassium chloride  40 mEq Oral Once   Continuous Infusions:   . sodium chloride      Principal Problem:  *Jaundice Active Problems:  Transaminitis  Hypokalemia    Time spent: > 35 mins    [facility]  [facility] Pager [phone]. If 8PM-8AM, please contact night-coverage at [email or web address], password [facility] 05/23/2012, 7:03 PM  LOS: 1 day

## 2012-05-24 ENCOUNTER — Encounter (HOSPITAL_COMMUNITY): Payer: Self-pay | Admitting: Internal Medicine

## 2012-05-24 DIAGNOSIS — B1910 Unspecified viral hepatitis B without hepatic coma: Principal | ICD-10-CM

## 2012-05-24 DIAGNOSIS — B169 Acute hepatitis B without delta-agent and without hepatic coma: Secondary | ICD-10-CM

## 2012-05-24 HISTORY — DX: Acute hepatitis B without delta-agent and without hepatic coma: B16.9

## 2012-05-24 LAB — BASIC METABOLIC PANEL
CO2: 26 mEq/L (ref 19–32)
Calcium: 8.6 mg/dL (ref 8.4–10.5)
Creatinine, Ser: 0.73 mg/dL (ref 0.50–1.10)
Glucose, Bld: 89 mg/dL (ref 70–99)

## 2012-05-24 LAB — CBC
MCH: 28.5 pg (ref 26.0–34.0)
MCV: 80.3 fL (ref 78.0–100.0)
Platelets: 278 10*3/uL (ref 150–400)
RBC: 3.86 MIL/uL — ABNORMAL LOW (ref 3.87–5.11)
RDW: 14.8 % (ref 11.5–15.5)

## 2012-05-24 LAB — URINE CULTURE

## 2012-05-24 LAB — HEPATIC FUNCTION PANEL
ALT: 2132 U/L — ABNORMAL HIGH (ref 0–35)
Albumin: 2.8 g/dL — ABNORMAL LOW (ref 3.5–5.2)
Alkaline Phosphatase: 163 U/L — ABNORMAL HIGH (ref 39–117)
Total Protein: 6.4 g/dL (ref 6.0–8.3)

## 2012-05-24 LAB — MITOCHONDRIAL ANTIBODIES: Mitochondrial M2 Ab, IgG: 0.54

## 2012-05-24 NOTE — Care Management Note (Signed)
    Page 1 of 1   05/24/2012     4:29:02 PM   CARE MANAGEMENT NOTE 05/24/2012  Patient:  Robin Marks, Robin Marks   Account Number:  [id number]  Date Initiated:  05/24/2012  Documentation initiated by:  [facility]  Subjective/Objective Assessment:   Admitted with jaundice, elevated LFTs     Action/Plan:   return home   Anticipated DC Date:  05/24/2012   Anticipated DC Plan:  HOME/SELF CARE      DC Planning Services  CM consult      Choice offered to / List presented to:             Status of service:  Completed, signed off Medicare Important Message given?   (If response is "NO", the following Medicare IM given date fields will be blank) Date Medicare IM given:   Date Additional Medicare IM given:    Discharge Disposition:  HOME/SELF CARE  Per UR Regulation:  Reviewed for med. necessity/level of care/duration of stay  If discussed at Long Length of Stay Meetings, dates discussed:    Comments:  05/24/12 Spoke with patient about PCP, gave her list of Primary Care Resources. Gave patient pharmacy discount card. Jacquelynn Cree RN, BSN, CCM

## 2012-05-24 NOTE — Discharge Summary (Signed)
Physician Discharge Summary  Robin Marks ION:[id number] DOB: December 23, 1980 DOA: 05/22/2012  PCP: No primary provider on file.  Admit date: 05/22/2012 Discharge date: 05/24/2012  Time spent: 60 minutes  Recommendations for Outpatient Follow-up:  1. Patient is to follow up with the bowel a gastroenterology one week post discharge for labs including CBC, comprehensive metabolic profile ,PT/ INR. Patient will subsequently be scheduled for follow up at that visit or she'll be seen by M.D. At that visit. 2. Patient has been given information to pick a PCP and follow up with PCP one week post discharge.  Discharge Diagnoses:  Principal Problem:  *Acute hepatitis b Active Problems:  Jaundice  Transaminitis  Hypokalemia   Discharge Condition: stable  Diet recommendation: Gen.  Filed Weights   05/23/12 0500 05/23/12 0600 05/24/12 0500  Weight: 75.297 kg (166 lb) 77.9 kg (171 lb 11.8 oz) 77.5 kg (170 lb 13.7 oz)    History of present illness:  Robin Marks is a 31 y.o. female with no significant PMHx who presents to ED with a tree day history of yellow eyes and dark urine. Patient states that approximately 9 days prior to admission felt she was bitten by a spider on her left upper extremity which has since resolved and on the lips. Patient stated that 3 days prior to admission noticed that her eyes were yellow in the urine was dark the patient denied any itching no swelling. Patient denies any fevers, no chest pain, no shortness of breath, no abdominal pain, no emesis, no diarrhea, no constipation. Patient does endorse some chills some nausea and generalized weakness as well as decreased appetite. Patient denies any IV drug use. Patient denies any alcohol dependence. Patient denies any ingestion of Tylenol. Patient states use ibuprofen as needed. Patient is sexually active with men and states she practices safe sex. Patient initially had presented to the urgent care Center and was noted to  be jaundiced and sent to the ED. In the ED comprehensive metabolic profile which was obtained came back with a transaminitis with a total bilirubin of 14.8. AST of 07/12/2003, ALT of 08/11/1999, lipase of 61. Magnesium of 1.9. Potassium of 2.8. Sodium of 134. CBC which was obtained at a white count of 7.6 hemoglobin of 13.2 platelet of 283. Urinalysis which was done showed large bilirubin in the urine. EKG showed a normal sinus rhythm. Urine pregnancy test was negative.  Abdominal ultrasound is pending. Will call to admit the patient for further evaluation and management.   Hospital Course:  #1 acute hepatitis B Questionable etiology. Patient had presented with scleral icterus and significantly elevated transaminases. INR was normal. Patient denied any unprotected sexual contact. Patient denied any IV drug use and also denied any alcohol dependence on admission. Abdominal ultrasound which was done on admission showed a hepatic steatosis otherwise was unremarkable. Neck is hepatitis panel was obtained and patient was placed on a MedSurg bed. ANA was also obtained as well as IgG levels. Acute hepatitis panel came back with hepatitis B surface antigen positive hepatitis B core IgM positive hepatitis C antibody negative hepatitis A IgM negative. HIV was also negative. It was felt that patient's transaminitis was secondary to an acute hepatitis B infection. Patient remained stable did not have any abdominal pain and was ambulating the halls. Patient was seen in consultation during this hospitalization by gastroenterology. It was felt that patient was safe for discharge on the day of discharge per gastroenterology and patient will followup with gastroenterology one week post  discharge for labs CBC, comprehensive metabolic profile, INR and hospital follow up. Patient be discharged home in stable condition.  #2 hypokalemia On admission patient was noted to be hypokalemic. Patient's potassium was repleted and her  hypokalemia had resolved by day of discharge.   Procedures: Abdominal ultrasound 05/22/2012   Consultations:  Gastroenterology: Dr. Rob Bunting 05/23/2012  Discharge Exam: Filed Vitals:   05/24/12 0551 05/24/12 1109 05/24/12 1113 05/24/12 1415  BP: 118/78 143/94 131/91 137/97  Pulse: 83 80 83 90  Temp: 98.1 F (36.7 C) 98.1 F (36.7 C)  98.2 F (36.8 C)  TempSrc: Oral Oral  Oral  Resp: 18 18  18   Height:      Weight:      SpO2: 97% 100%  99%    General: NAD. SCLERAL ICTERIS Cardiovascular: RRR Respiratory: CTAB  Discharge Instructions  Discharge Orders    Future Orders Please Complete By Expires   Diet general      Increase activity slowly      Discharge instructions      Comments:   Follow up with PCP in 1 week Follow up with [facility], Dr Christella Hartigan office for lab work in 1 week and follow up after.       Medication List     As of 05/24/2012  2:36 PM    TAKE these medications         ibuprofen 200 MG tablet   Commonly known as: ADVIL,MOTRIN   Take 200 mg by mouth every 6 (six) hours as needed. For pain           Follow-up Information    Follow up with Rob Bunting, MD. Schedule an appointment as soon as possible for a visit in 1 week. (follow up with [facility]/Dr Christella Hartigan in 1 week for labwork and follow up.)    Contact information:   [address] [place] [address] [phone]       Please follow up. (f/u with PCP in 1 week)           The results of significant diagnostics from this hospitalization (including imaging, microbiology, ancillary and laboratory) are listed below for reference.    Significant Diagnostic Studies: US Abdomen Complete  05/22/2012  *RADIOLOGY REPORT*  Clinical Data:  Elevated liver enzymes.  ABDOMINAL ULTRASOUND COMPLETE  Comparison:  None.  Findings:  Gallbladder:  No gallstones, gallbladder wall thickening, or pericholecystic fluid.  Common Bile Duct:  Within normal limits in caliber.   Liver: No focal mass.  Diffusely increased in echogenicity most compatible with diffuse hepatic steatosis.  IVC:  Appears normal.  Pancreas:  Obscured by overlying bowel gas.  Spleen:  Within normal limits in size and echotexture.  Right kidney:  No hydronephrosis or mass.  Normal in size and echogenicity.  Benign appearing 1.9 cm cyst in the upper pole.  Left kidney:  Normal in size and parenchymal echogenicity.  No evidence of mass or hydronephrosis.  Abdominal Aorta:  No aneurysm identified.  IMPRESSION: Increased echogenicity throughout the liver most consistent with diffuse hepatic steatosis.  Simple right renal cyst.  Pancreas was obscured by overlying bowel gas.   Original Report Authenticated By: Jolaine Click, M.D.     Microbiology: Recent Results (from the past 240 hour(s))  URINE CULTURE     Status: Normal   Collection Time   05/23/12  9:25 AM      Component Value Range Status Comment   Specimen Description URINE, RANDOM  Final    Special Requests NONE   Final    Culture  Setup Time 05/23/2012 12:34   Final    Colony Count NO GROWTH   Final    Culture NO GROWTH   Final    Report Status 05/24/2012 FINAL   Final      Labs: Basic Metabolic Panel:  Lab 05/24/12 1610 05/23/12 0735 05/22/12 2151 05/22/12 1604  NA 134* 135 -- 134*  K 3.6 3.8 -- 2.8*  CL 100 102 -- 96  CO2 26 22 -- 27  GLUCOSE 89 93 -- 95  BUN 4* 6 -- 7  CREATININE 0.73 0.69 0.74 0.78  CALCIUM 8.6 8.9 -- 9.2  MG -- -- -- 1.9  PHOS -- -- -- --   Liver Function Tests:  Lab 05/24/12 0605 05/23/12 0735 05/22/12 2151 05/22/12 1604  AST 1661* 1591* 1798* 1905*  ALT 2132* 2343* 2196* 2801*  ALKPHOS 163* 183* 194* 213*  BILITOT 15.4* 14.9* 14.7* 14.8*  PROT 6.4 7.0 7.1 7.8  ALBUMIN 2.8* 3.0* 3.2* 3.4*    Lab 05/23/12 0735 05/22/12 1604  LIPASE 90* 61*  AMYLASE -- --   No results found for this basename: AMMONIA:5 in the last 168 hours CBC:  Lab 05/24/12 0605 05/23/12 0735 05/22/12 2151 05/22/12 1604  WBC  6.1 7.2 7.9 7.6  NEUTROABS -- -- -- 4.5  HGB 11.0* 11.7* 12.6 13.2  HCT 31.0* 32.4* 34.7* 35.9*  MCV 80.3 78.1 79.2 78.7  PLT 278 281 283 283   Cardiac Enzymes: No results found for this basename: CKTOTAL:5,CKMB:5,CKMBINDEX:5,TROPONINI:5 in the last 168 hours BNP: BNP (last 3 results) No results found for this basename: PROBNP:3 in the last 8760 hours CBG: No results found for this basename: GLUCAP:5 in the last 168 hours     Signed:  THOMPSON,DANIEL  [facility] 05/24/2012, 2:36 PM

## 2012-05-24 NOTE — Clinical Social Work Psychosocial (Signed)
     Clinical Social Work Department BRIEF PSYCHOSOCIAL ASSESSMENT 05/24/2012  Patient:  Robin Marks, Robin Marks     Account Number:  [id number]     Admit date:  05/22/2012  Clinical Social Worker:  Peggyann Shoals  Date/Time:  05/24/2012 05:26 PM  Referred by:  Physician  Date Referred:  05/24/2012 Referred for  Other - See comment   Other Referral:   Other psychosocial needs   Interview type:  Patient Other interview type:    PSYCHOSOCIAL DATA Living Status:  FAMILY Admitted from facility:   Level of care:   Primary support name:  Oswaldo Done Primary support relationship to patient:  FAMILY Degree of support available:   adequate    CURRENT CONCERNS Current Concerns  Adjustment to Illness   Other Concerns:    SOCIAL WORK ASSESSMENT / PLAN CSW met with pt to address consult. Per MD, pt has several questions. Pt was recently diagnosised with Hep B. Reporting procedure was clarified by ID.    Pt lives with her family and is employed full time. Pt shared that does not believe her new diagnosis as he does not have a previous high risk history and has not reported any symptoms.    RNCM is aware and following for PCP follow and FMLA paperwork. Pt shared that she does not want to go to work while until she has recovered from this as she feels it would be unprofessional.    CSW provided psychosocial support around diagnosis. Pt shared that she would like to discharge home. CSW is signing off as no further needs have been identified.   Assessment/plan status:  No Further Intervention Required Other assessment/ plan:   Information/referral to community resources:   none requested.    PATIENTS/FAMILYS RESPONSE TO PLAN OF CARE: Pt was alert and oriented. Pt shared that she was thankful for CSW intervention.

## 2012-05-24 NOTE — Progress Notes (Signed)
Spoke with Dr Luciana Axe of ID.   The health dept is automatically notified of a positive test for Hep B, so we do not need to initiate contact with the authorities.  Robin Marks

## 2012-05-24 NOTE — Progress Notes (Signed)
[facility] Progress Note   Subjective: Eating well, ambulating, no abd pains   Objective: Vital signs in last 24 hours: Temp:  [98 F (36.7 C)-98.7 F (37.1 C)] 98.1 F (36.7 C) (11/22 0551) Pulse Rate:  [79-96] 83  (11/22 0551) Resp:  [18-20] 18  (11/22 0551) BP: (109-146)/(66-106) 118/78 mmHg (11/22 0551) SpO2:  [97 %-100 %] 97 % (11/22 0551) Weight:  [170 lb 13.7 oz (77.5 kg)] 170 lb 13.7 oz (77.5 kg) (11/22 0500) Last BM Date: 05/23/12 General: alert and oriented times 3 Heart: regular rate and rythm Abdomen: soft, non-tender, non-distended, normal bowel sounds    Lab Results:  Basename 05/24/12 0605 05/23/12 0735 05/22/12 2151  WBC 6.1 7.2 7.9  HGB 11.0* 11.7* 12.6  PLT 278 281 283  MCV 80.3 78.1 79.2    Basename 05/24/12 0605 05/23/12 0735 05/22/12 2151 05/22/12 1604  NA 134* 135 -- 134*  K 3.6 3.8 -- 2.8*  CL 100 102 -- 96  CO2 26 22 -- 27  GLUCOSE 89 93 -- 95  BUN 4* 6 -- 7  CREATININE 0.73 0.69 0.74 --  CALCIUM 8.6 8.9 -- 9.2    Basename 05/24/12 0605 05/23/12 0735 05/22/12 2151  PROT 6.4 7.0 7.1  ALBUMIN 2.8* 3.0* 3.2*  AST 1661* 1591* 1798*  ALT 2132* 2343* 2196*  ALKPHOS 163* 183* 194*  BILITOT 15.4* 14.9* 14.7*  BILIDIR PENDING 11.7* 11.4*  IBILI PENDING 3.2* 3.3*    Basename 05/24/12 0605 05/23/12 0735  INR 1.15 1.17    Hep B Surface Ag ++ Hep B Core IgM ++ Hep C Ab neg Hep A IgM neg HIV neg  Assessment/Plan: 31 y.o. 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  I explained diagnosis to her.  Sexual contact and IV drug use are clearly most common causes in [place]. She does not use IV drugs but is sexually active.  She is at very low risk for hepatic failure, very low risk for this becoming a chronic problem for her.  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  I cannot answer those questions except to tell her  that she is very unlikely to transmit the disease except by blood contact, sexual contact. I advised her to discuss situation with her HR department.    I think she is safe for discharge home.  I will place social work consult as well to help with other questions.  Are we required to report this to city, state health dept?    We will make sure she has labs checked next week ([facility]).  CBC, CMT, INR and I will contact her with those results, plan follow up from there.     Rob Bunting, MD  05/24/2012, 8:03 AM [facility] Pager [phone]

## 2012-05-27 ENCOUNTER — Other Ambulatory Visit: Payer: Self-pay

## 2012-05-28 ENCOUNTER — Other Ambulatory Visit: Payer: Self-pay

## 2012-06-06 ENCOUNTER — Other Ambulatory Visit: Payer: Self-pay

## 2012-06-06 ENCOUNTER — Other Ambulatory Visit (INDEPENDENT_AMBULATORY_CARE_PROVIDER_SITE_OTHER): Payer: Self-pay

## 2012-06-06 DIAGNOSIS — R7989 Other specified abnormal findings of blood chemistry: Secondary | ICD-10-CM

## 2012-06-06 LAB — PROTIME-INR
INR: 1.1 ratio — ABNORMAL HIGH (ref 0.8–1.0)
Prothrombin Time: 11.2 s (ref 10.2–12.4)

## 2012-06-06 LAB — CBC WITH DIFFERENTIAL/PLATELET
Basophils Relative: 0.8 % (ref 0.0–3.0)
Eosinophils Absolute: 0.1 10*3/uL (ref 0.0–0.7)
HCT: 35.5 % — ABNORMAL LOW (ref 36.0–46.0)
Lymphs Abs: 1.9 10*3/uL (ref 0.7–4.0)
MCHC: 33.2 g/dL (ref 30.0–36.0)
MCV: 87.5 fl (ref 78.0–100.0)
Monocytes Absolute: 0.4 10*3/uL (ref 0.1–1.0)
Neutro Abs: 4.1 10*3/uL (ref 1.4–7.7)
Neutrophils Relative %: 62.5 % (ref 43.0–77.0)
RBC: 4.05 Mil/uL (ref 3.87–5.11)

## 2012-06-06 LAB — COMPREHENSIVE METABOLIC PANEL
AST: 73 U/L — ABNORMAL HIGH (ref 0–37)
Alkaline Phosphatase: 88 U/L (ref 39–117)
BUN: 7 mg/dL (ref 6–23)
Creatinine, Ser: 0.8 mg/dL (ref 0.4–1.2)
Potassium: 4.3 mEq/L (ref 3.5–5.1)
Total Bilirubin: 4 mg/dL — ABNORMAL HIGH (ref 0.3–1.2)

## 2012-06-11 ENCOUNTER — Telehealth: Payer: Self-pay | Admitting: Gastroenterology

## 2012-06-11 NOTE — Telephone Encounter (Signed)
Please call the patient. Her liver tests are still not normal but are significantly improved! She needs repeat LFTs, INR and also Hepatitis B Surface Antibody checked in 3 weeks, with rov shortly afterwards. Her Acute Hepatitis B is resolving.  I have tried to reach pt and no one answered the phone Left message on machine to call back

## 2012-06-12 NOTE — Telephone Encounter (Signed)
Unable to reach pt by phone letter mailed.  

## 2012-06-13 ENCOUNTER — Telehealth: Payer: Self-pay

## 2012-06-13 ENCOUNTER — Other Ambulatory Visit: Payer: Self-pay

## 2012-06-13 ENCOUNTER — Telehealth: Payer: Self-pay | Admitting: Gastroenterology

## 2012-06-13 DIAGNOSIS — B169 Acute hepatitis B without delta-agent and without hepatic coma: Secondary | ICD-10-CM

## 2012-06-13 NOTE — Telephone Encounter (Signed)
Unable to reach the pt to give her lab results I have tried numerous times and the number given is not accepting any voice mail messages.  I did mail a letter to the pt to contact us with a good number to reach her at.

## 2012-06-13 NOTE — Telephone Encounter (Signed)
Dr Christella Hartigan the pt called and I gave her the lab results and she will be in to have labs rechecked but I could not get her a follow up appt the schedule is not out for Feb yet and you have no openings.  Also she wants a note to return to work.  Can I give her that?

## 2012-06-13 NOTE — Telephone Encounter (Signed)
She can have a note back to work.  Needs to be blood contact to transmit.  Safe sex practices with condoms or abstinence.

## 2012-06-14 NOTE — Telephone Encounter (Signed)
Pt is aware that she can have a note to return to work she gave me a number to fax to her employer [employer] [phone]

## 2014-09-15 IMAGING — US US ABDOMEN COMPLETE
1 series · 14 of 25 positions shown · non-contrast
Comparison: None.

CLINICAL DATA: Elevated liver enzymes.

ABDOMINAL ULTRASOUND COMPLETE

[Series 1: us abdomen complete · 0.26mm/px · 14 of 114 slices shown]
[im 1/114]
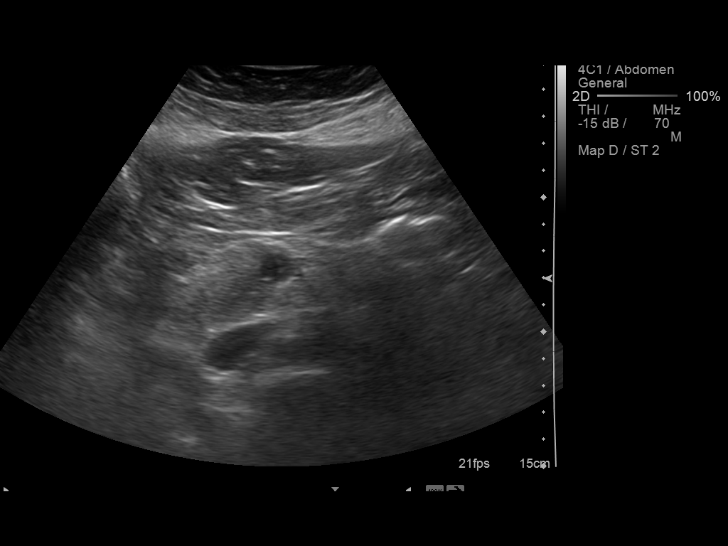
[im 10/114]
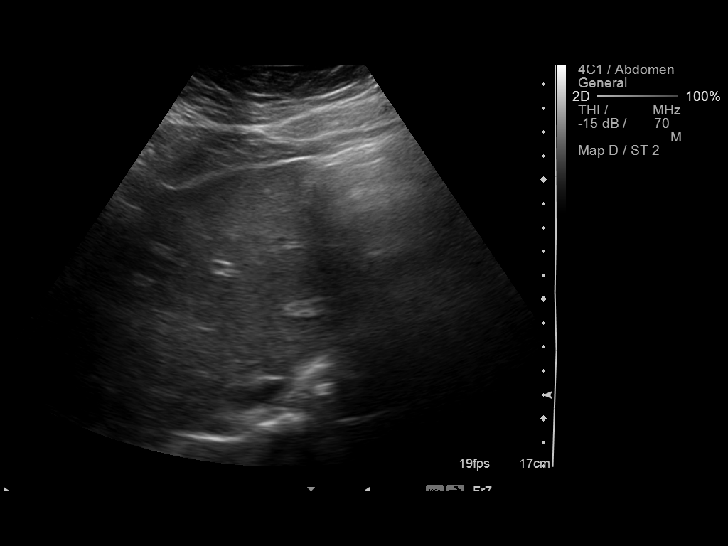
[im 19/114]
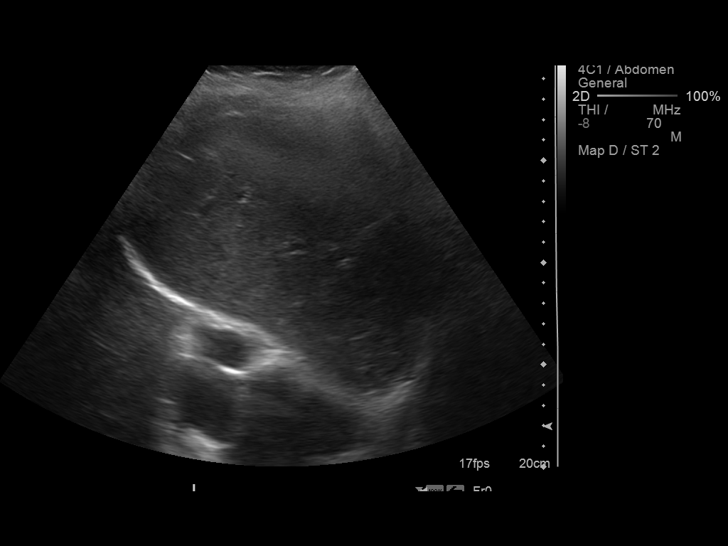
[im 29/114]
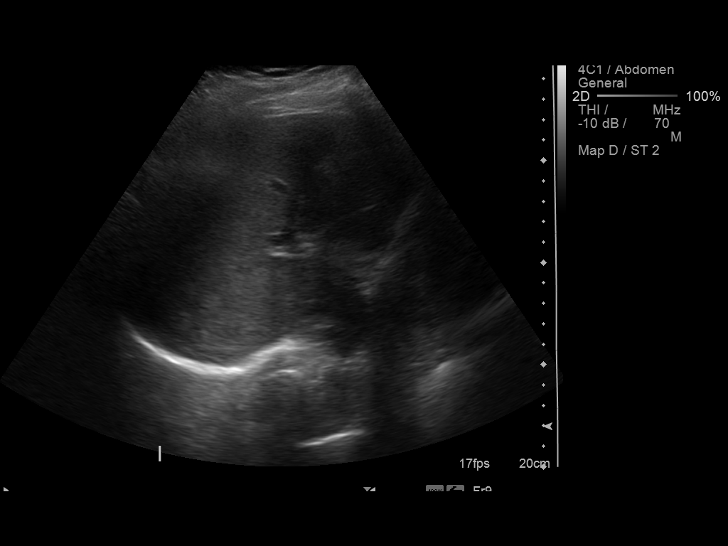
[im 38/114]
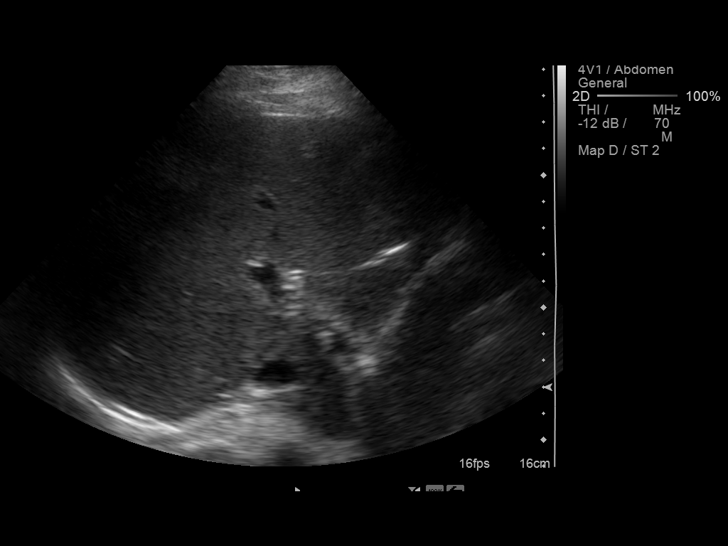
[im 43/114]
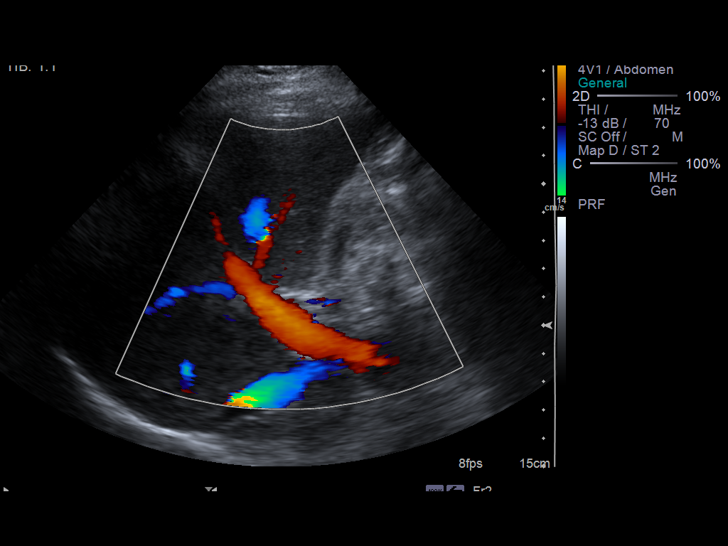
[im 52/114]
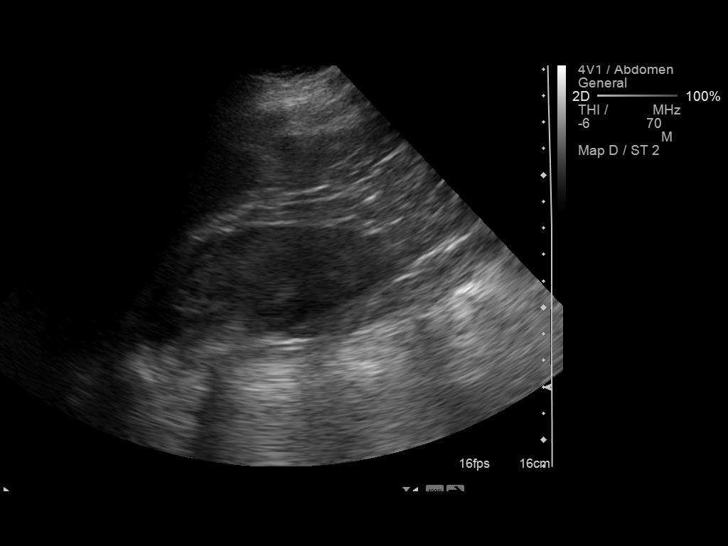
[im 62/114]
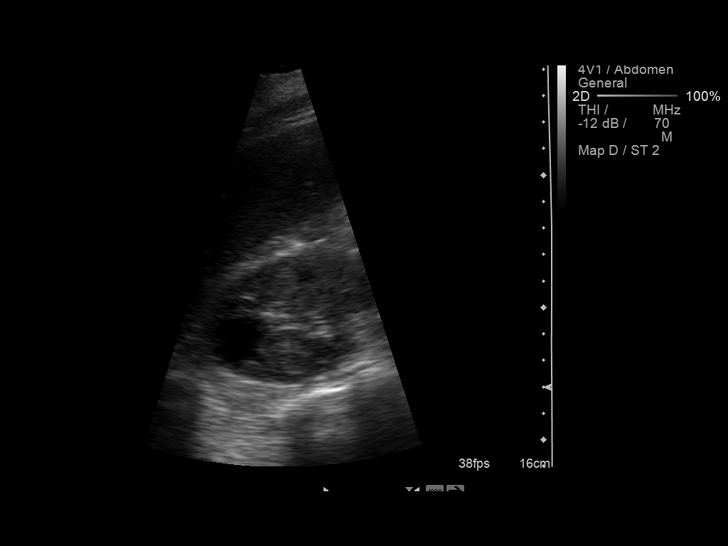
[im 71/114]
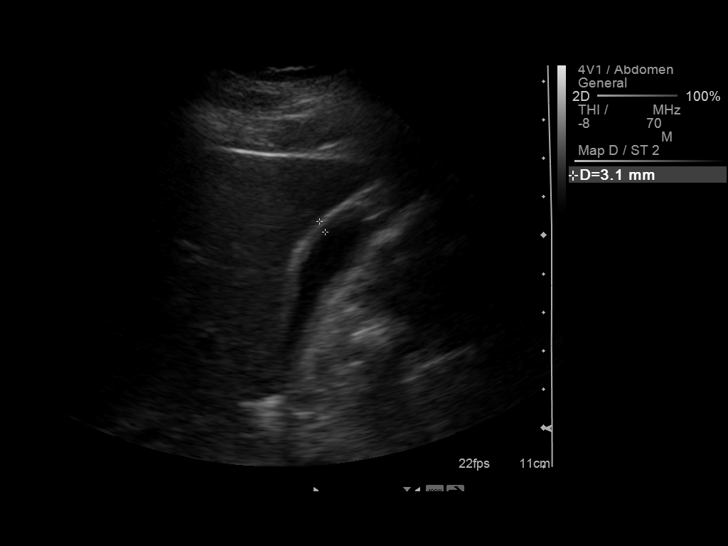
[im 76/114]
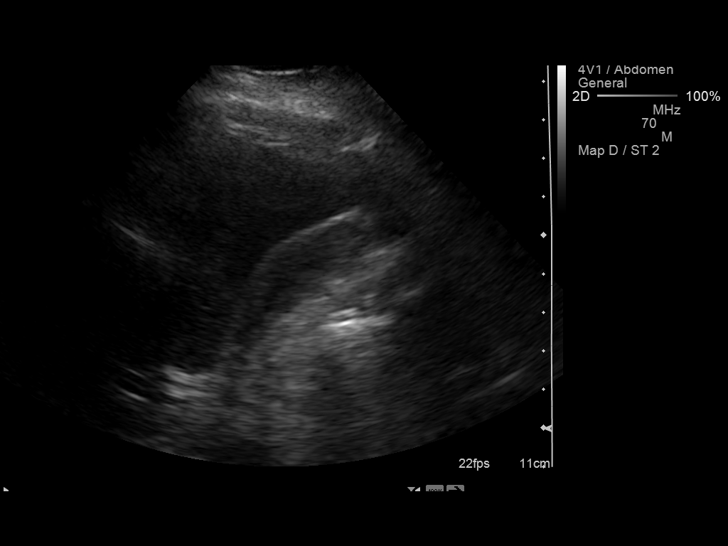
[im 85/114]
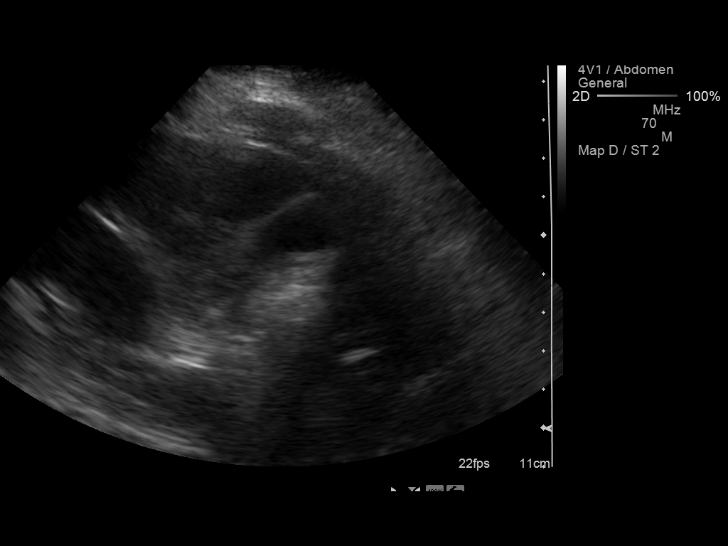
[im 95/114]
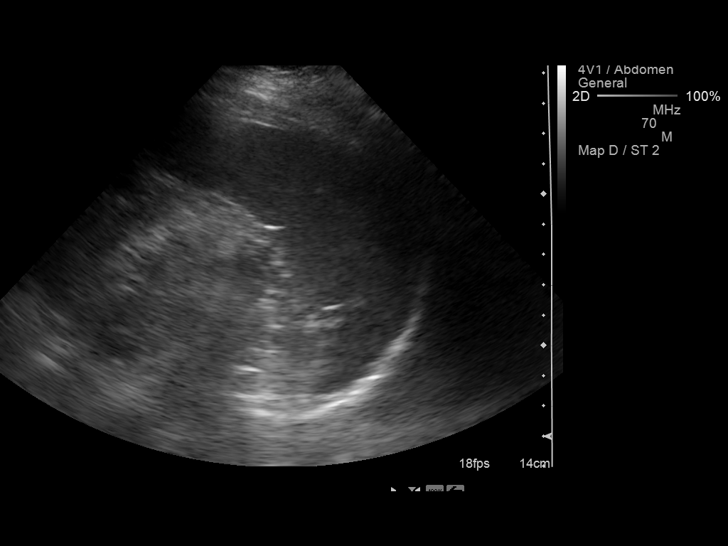
[im 104/114]
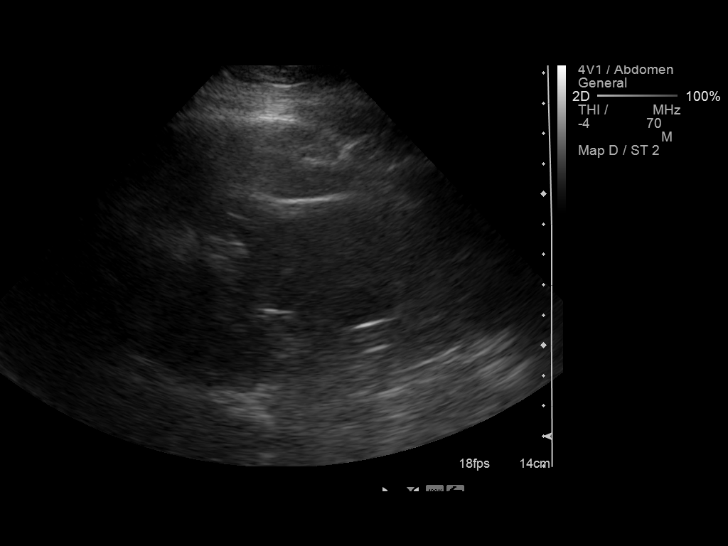
[im 114/114]
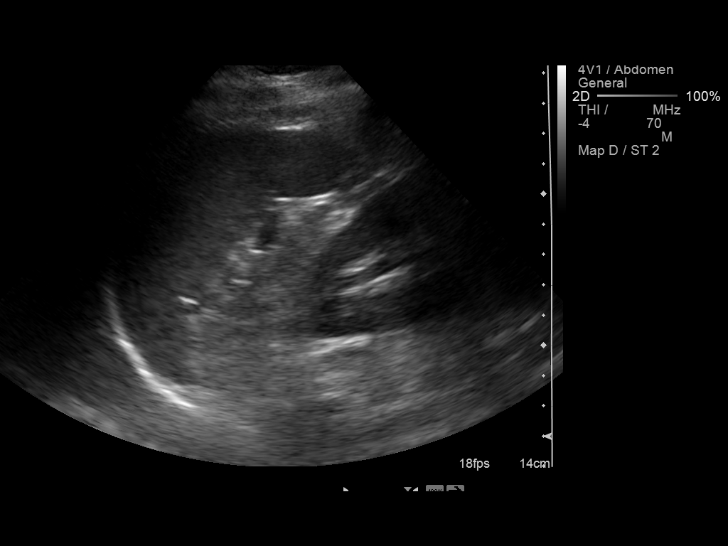

[14 of 25 positions shown; findings below may reference images not displayed]

FINDINGS: Gallbladder:  No gallstones, gallbladder wall thickening, or
pericholecystic fluid.

Common Bile Duct:  Within normal limits in caliber.

Liver: No focal mass.  Diffusely increased in echogenicity most
compatible with diffuse hepatic steatosis.

IVC:  Appears normal.

Pancreas:  Obscured by overlying bowel gas.

Spleen:  Within normal limits in size and echotexture.

Right kidney:  No hydronephrosis or mass.  Normal in size and
echogenicity.  Benign appearing 1.9 cm cyst in the upper pole.

Left kidney:  Normal in size and parenchymal echogenicity.  No
evidence of mass or hydronephrosis.

Abdominal Aorta:  No aneurysm identified.
IMPRESSION: Increased echogenicity throughout the liver most consistent with
diffuse hepatic steatosis.

Simple right renal cyst.

Pancreas was obscured by overlying bowel gas.

## 2017-12-05 ENCOUNTER — Ambulatory Visit (INDEPENDENT_AMBULATORY_CARE_PROVIDER_SITE_OTHER): Payer: Medicaid Other | Admitting: Obstetrics and Gynecology

## 2017-12-05 ENCOUNTER — Encounter: Payer: Self-pay | Admitting: *Deleted

## 2017-12-05 ENCOUNTER — Other Ambulatory Visit (HOSPITAL_COMMUNITY)
Admission: RE | Admit: 2017-12-05 | Discharge: 2017-12-05 | Disposition: A | Discharge status: Home / Self Care | Payer: Medicaid Other | Source: Ambulatory Visit | Attending: Obstetrics and Gynecology | Admitting: Obstetrics and Gynecology

## 2017-12-05 ENCOUNTER — Encounter: Payer: Self-pay | Admitting: Obstetrics and Gynecology

## 2017-12-05 DIAGNOSIS — Z3490 Encounter for supervision of normal pregnancy, unspecified, unspecified trimester: Secondary | ICD-10-CM | POA: Diagnosis not present

## 2017-12-05 DIAGNOSIS — O09522 Supervision of elderly multigravida, second trimester: Secondary | ICD-10-CM

## 2017-12-05 DIAGNOSIS — O09529 Supervision of elderly multigravida, unspecified trimester: Secondary | ICD-10-CM | POA: Insufficient documentation

## 2017-12-05 DIAGNOSIS — Z98891 History of uterine scar from previous surgery: Secondary | ICD-10-CM

## 2017-12-05 DIAGNOSIS — Z3483 Encounter for supervision of other normal pregnancy, third trimester: Secondary | ICD-10-CM | POA: Insufficient documentation

## 2017-12-05 NOTE — Progress Notes (Signed)
Subjective:  Robin Marks is a 37 y.o. [phone] at 8055w6d being seen today for her first OB visit. Uncertain LMP.  H/O c section x 3. H/O PTD at 36-37 weeks d/t SOL. H/O Hep B. ( Pt does not think Dx is correct. Did not follow up with GI) H/O failed tubal (clips).Denies chronic medical problems or medications.   She is currently monitored for the following issues for this high-risk pregnancy and has Transaminitis; Acute hepatitis B; Encounter for supervision of normal pregnancy, unspecified, unspecified trimester; AMA (advanced maternal age) multigravida 35+; and History of cesarean section on their problem list.  Patient reports no complaints.  Contractions: Not present. Vag. Bleeding: None.  Movement: Present. Denies leaking of fluid.   The following portions of the patient's history were reviewed and updated as appropriate: allergies, current medications, past family history, past medical history, past social history, past surgical history and problem list. Problem list updated.  Objective:   Vitals:   12/05/17 1408  BP: 122/84  Pulse: 84  Weight: 184 lb 8 oz (83.7 kg)    Fetal Status: Fetal Heart Rate (bpm): 145   Movement: Present     General:  Alert, oriented and cooperative. Patient is in no acute distress.  Skin: Skin is warm and dry. No rash noted.   Cardiovascular: Normal heart rate noted  Respiratory: Normal respiratory effort, no problems with respiration noted  Abdomen: Soft, gravid, appropriate for gestational age. Pain/Pressure: Absent     Pelvic:  Cervical exam performed        Extremities: Normal range of motion.  Edema: None  Mental Status: Normal mood and affect. Normal behavior. Normal judgment and thought content.  Breast Sym supple no masses nipple discharge or adenopathy  Urinalysis:      Assessment and Plan:  Pregnancy: [id number] at 1655w6d  1. Encounter for supervision of normal pregnancy, antepartum, unspecified gravidity Prenatal care and labs reviewed with  pt. - Obstetric Panel, Including HIV - Culture, OB Urine - Cystic Fibrosis Mutation 97 - Hemoglobinopathy evaluation - SMN1 COPY NUMBER ANALYSIS (SMA Carrier Screen) - Genetic Screening - Cytology - PAP - US MFM OB COMP + 14 WK; Future - Hepatitis C antibody  2. Multigravida of advanced maternal age in second trimester AMA reviewed  Will check Panorama  3. History of cesarean section Will need repeat Also desires tubal, recommended, modified [place] d/t to failed clips in the past Will need to sign papers at 28 weeks  Preterm labor symptoms and general obstetric precautions including but not limited to vaginal bleeding, contractions, leaking of fluid and fetal movement were reviewed in detail with the patient. Please refer to After Visit Summary for other counseling recommendations.  Return in about 1 month (around 01/02/2018) for OB visit.   Hermina StaggersErvin, Recardo Linn L, MD

## 2017-12-05 NOTE — Patient Instructions (Signed)

## 2017-12-07 LAB — HEPATITIS C ANTIBODY: Hep C Virus Ab: <0.1 s/co ratio (ref 0.0–0.9)

## 2017-12-07 LAB — OBSTETRIC PANEL, INCLUDING HIV
ANTIBODY SCREEN: NEGATIVE
BASOS ABS: 0 10*3/uL (ref 0.0–0.2)
BASOS: 0 %
EOS (ABSOLUTE): 0 10*3/uL (ref 0.0–0.4)
Eos: 0 %
HEMATOCRIT: 31.7 % — AB (ref 34.0–46.6)
HIV Screen 4th Generation wRfx: NONREACTIVE
Hemoglobin: 10.6 g/dL — ABNORMAL LOW (ref 11.1–15.9)
Hepatitis B Surface Ag: NEGATIVE
Immature Grans (Abs): 0 10*3/uL (ref 0.0–0.1)
Immature Granulocytes: 0 %
LYMPHS ABS: 1.6 10*3/uL (ref 0.7–3.1)
Lymphs: 19 %
MCH: 29.3 pg (ref 26.6–33.0)
MCHC: 33.4 g/dL (ref 31.5–35.7)
MCV: 88 fL (ref 79–97)
MONOCYTES: 3 %
MONOS ABS: 0.3 10*3/uL (ref 0.1–0.9)
NEUTROS ABS: 6.4 10*3/uL (ref 1.4–7.0)
Neutrophils: 78 %
PLATELETS: 287 10*3/uL (ref 150–450)
RBC: 3.62 x10E6/uL — AB (ref 3.77–5.28)
RDW: 14.2 % (ref 12.3–15.4)
RPR Ser Ql: NONREACTIVE
RUBELLA: 1.09 {index} (ref >0.99)
Rh Factor: POSITIVE
WBC: 8.2 10*3/uL (ref 3.4–10.8)

## 2017-12-07 LAB — HEMOGLOBINOPATHY EVALUATION
HEMOGLOBIN A2 QUANTITATION: 3.4 % — AB (ref 1.8–3.2)
HGB C: 0 %
HGB S: 38 % — AB
HGB VARIANT: 0 %
Hemoglobin F Quantitation: 0 % (ref 0.0–2.0)
Hgb A: 58.6 % — ABNORMAL LOW (ref 96.4–98.8)

## 2017-12-07 LAB — URINE CULTURE, OB REFLEX

## 2017-12-07 LAB — CULTURE, OB URINE

## 2017-12-08 LAB — CYTOLOGY - PAP
DIAGNOSIS: NEGATIVE
HPV (WINDOPATH): NOT DETECTED

## 2017-12-10 ENCOUNTER — Encounter (HOSPITAL_COMMUNITY): Payer: Self-pay

## 2017-12-11 ENCOUNTER — Encounter: Payer: Self-pay | Admitting: *Deleted

## 2017-12-12 ENCOUNTER — Encounter: Payer: Self-pay | Admitting: Family Medicine

## 2017-12-12 DIAGNOSIS — O0930 Supervision of pregnancy with insufficient antenatal care, unspecified trimester: Secondary | ICD-10-CM | POA: Insufficient documentation

## 2017-12-12 DIAGNOSIS — Z302 Encounter for sterilization: Secondary | ICD-10-CM | POA: Insufficient documentation

## 2017-12-12 LAB — SMN1 COPY NUMBER ANALYSIS (SMA CARRIER SCREENING)

## 2017-12-14 LAB — CYSTIC FIBROSIS MUTATION 97: GENE DIS ANAL CARRIER INTERP BLD/T-IMP: NOT DETECTED

## 2017-12-17 ENCOUNTER — Other Ambulatory Visit: Payer: Self-pay | Admitting: Obstetrics and Gynecology

## 2017-12-17 ENCOUNTER — Encounter (HOSPITAL_COMMUNITY): Payer: Self-pay

## 2017-12-17 ENCOUNTER — Ambulatory Visit (HOSPITAL_COMMUNITY)
Admission: RE | Admit: 2017-12-17 | Discharge: 2017-12-17 | Disposition: A | Discharge status: Home / Self Care | Payer: Medicaid Other | Source: Ambulatory Visit | Attending: Obstetrics and Gynecology | Admitting: Obstetrics and Gynecology

## 2017-12-17 DIAGNOSIS — O0932 Supervision of pregnancy with insufficient antenatal care, second trimester: Secondary | ICD-10-CM | POA: Diagnosis not present

## 2017-12-17 DIAGNOSIS — O34219 Maternal care for unspecified type scar from previous cesarean delivery: Secondary | ICD-10-CM

## 2017-12-17 DIAGNOSIS — O09212 Supervision of pregnancy with history of pre-term labor, second trimester: Secondary | ICD-10-CM | POA: Insufficient documentation

## 2017-12-17 DIAGNOSIS — O09522 Supervision of elderly multigravida, second trimester: Secondary | ICD-10-CM

## 2017-12-17 DIAGNOSIS — Z3490 Encounter for supervision of normal pregnancy, unspecified, unspecified trimester: Secondary | ICD-10-CM

## 2017-12-17 DIAGNOSIS — Z3687 Encounter for antenatal screening for uncertain dates: Secondary | ICD-10-CM

## 2017-12-17 DIAGNOSIS — Z363 Encounter for antenatal screening for malformations: Secondary | ICD-10-CM | POA: Insufficient documentation

## 2017-12-17 DIAGNOSIS — O99212 Obesity complicating pregnancy, second trimester: Secondary | ICD-10-CM

## 2017-12-17 DIAGNOSIS — Z3A26 26 weeks gestation of pregnancy: Secondary | ICD-10-CM | POA: Diagnosis not present

## 2017-12-18 ENCOUNTER — Other Ambulatory Visit: Payer: Self-pay

## 2017-12-18 DIAGNOSIS — Z3490 Encounter for supervision of normal pregnancy, unspecified, unspecified trimester: Secondary | ICD-10-CM

## 2017-12-18 NOTE — Progress Notes (Signed)
U/S for F/U anatomy ordered per Dr.Ervin.

## 2018-01-02 ENCOUNTER — Ambulatory Visit (INDEPENDENT_AMBULATORY_CARE_PROVIDER_SITE_OTHER): Payer: Medicaid Other | Admitting: Obstetrics & Gynecology

## 2018-01-02 ENCOUNTER — Other Ambulatory Visit: Payer: Medicaid Other

## 2018-01-02 DIAGNOSIS — Z3490 Encounter for supervision of normal pregnancy, unspecified, unspecified trimester: Secondary | ICD-10-CM

## 2018-01-02 NOTE — Patient Instructions (Signed)

## 2018-01-02 NOTE — Progress Notes (Signed)
   PRENATAL VISIT NOTE  Subjective:  Robin Marks is a 37 y.o. [phone] at 7929w6d being seen today for ongoing prenatal care.  She is currently monitored for the following issues for this low-risk pregnancy and has Acute hepatitis B; Encounter for supervision of normal pregnancy, unspecified, unspecified trimester; AMA (advanced maternal age) multigravida 35+; History of cesarean section; Failed BTS- [place]; and Late prenatal care on their problem list.  Patient reports no complaints.  Contractions: Not present. Vag. Bleeding: None.  Movement: Present. Denies leaking of fluid.   The following portions of the patient's history were reviewed and updated as appropriate: allergies, current medications, past family history, past medical history, past social history, past surgical history and problem list. Problem list updated.  Objective:   Vitals:   01/02/18 0816  BP: 108/76  Pulse: 89  Weight: 187 lb (84.8 kg)    Fetal Status:     Movement: Present     General:  Alert, oriented and cooperative. Patient is in no acute distress.  Skin: Skin is warm and dry. No rash noted.   Cardiovascular: Normal heart rate noted  Respiratory: Normal respiratory effort, no problems with respiration noted  Abdomen: Soft, gravid, appropriate for gestational age.  Pain/Pressure: Absent     Pelvic: Cervical exam deferred        Extremities: Normal range of motion.     Mental Status: Normal mood and affect. Normal behavior. Normal judgment and thought content.   Assessment and Plan:  Pregnancy: [id number] at 1729w6d  1. Encounter for supervision of normal pregnancy, antepartum, unspecified gravidity F/u anatomy - US MFM OB FOLLOW UP; Future  Preterm labor symptoms and general obstetric precautions including but not limited to vaginal bleeding, contractions, leaking of fluid and fetal movement were reviewed in detail with the patient. Please refer to After Visit Summary for other counseling recommendations.    Return in about 2 weeks (around 01/16/2018).  Future Appointments  Date Time Provider Department Center  01/15/2018  2:30 PM [facility] US 5 [facility]    Scheryl DarterJames Arnold, MD

## 2018-01-02 NOTE — Progress Notes (Signed)
Pt is not fasting for 2GTT today. Will need to be rescheduled.

## 2018-01-07 ENCOUNTER — Other Ambulatory Visit: Payer: Medicaid Other

## 2018-01-07 DIAGNOSIS — Z3490 Encounter for supervision of normal pregnancy, unspecified, unspecified trimester: Secondary | ICD-10-CM

## 2018-01-08 LAB — CBC
Hematocrit: 33.4 % — ABNORMAL LOW (ref 34.0–46.6)
Hemoglobin: 10.8 g/dL — ABNORMAL LOW (ref 11.1–15.9)
MCH: 29.2 pg (ref 26.6–33.0)
MCHC: 32.3 g/dL (ref 31.5–35.7)
MCV: 90 fL (ref 79–97)
PLATELETS: 232 10*3/uL (ref 150–450)
RBC: 3.7 x10E6/uL — ABNORMAL LOW (ref 3.77–5.28)
RDW: 14.2 % (ref 12.3–15.4)
WBC: 7.3 10*3/uL (ref 3.4–10.8)

## 2018-01-08 LAB — HIV ANTIBODY (ROUTINE TESTING W REFLEX): HIV Screen 4th Generation wRfx: NONREACTIVE

## 2018-01-08 LAB — GLUCOSE TOLERANCE, 2 HOURS W/ 1HR
GLUCOSE, 2 HOUR: 100 mg/dL (ref 65–152)
Glucose, 1 hour: 128 mg/dL (ref 65–179)
Glucose, Fasting: 81 mg/dL (ref 65–91)

## 2018-01-08 LAB — RPR: RPR Ser Ql: NONREACTIVE

## 2018-01-15 ENCOUNTER — Ambulatory Visit (HOSPITAL_COMMUNITY)
Admission: RE | Admit: 2018-01-15 | Discharge: 2018-01-15 | Disposition: A | Discharge status: Home / Self Care | Payer: Medicaid Other | Source: Ambulatory Visit | Attending: Obstetrics and Gynecology | Admitting: Obstetrics and Gynecology

## 2018-01-15 ENCOUNTER — Other Ambulatory Visit: Payer: Self-pay | Admitting: Obstetrics & Gynecology

## 2018-01-15 DIAGNOSIS — O0933 Supervision of pregnancy with insufficient antenatal care, third trimester: Secondary | ICD-10-CM

## 2018-01-15 DIAGNOSIS — O09522 Supervision of elderly multigravida, second trimester: Secondary | ICD-10-CM

## 2018-01-15 DIAGNOSIS — Z3490 Encounter for supervision of normal pregnancy, unspecified, unspecified trimester: Secondary | ICD-10-CM | POA: Diagnosis present

## 2018-01-15 DIAGNOSIS — Z362 Encounter for other antenatal screening follow-up: Secondary | ICD-10-CM | POA: Insufficient documentation

## 2018-01-15 DIAGNOSIS — Z98891 History of uterine scar from previous surgery: Secondary | ICD-10-CM

## 2018-01-15 DIAGNOSIS — O99212 Obesity complicating pregnancy, second trimester: Secondary | ICD-10-CM

## 2018-01-15 DIAGNOSIS — Z3A30 30 weeks gestation of pregnancy: Secondary | ICD-10-CM

## 2018-01-15 DIAGNOSIS — O0930 Supervision of pregnancy with insufficient antenatal care, unspecified trimester: Secondary | ICD-10-CM

## 2018-01-15 DIAGNOSIS — Z3493 Encounter for supervision of normal pregnancy, unspecified, third trimester: Secondary | ICD-10-CM | POA: Diagnosis not present

## 2018-01-16 ENCOUNTER — Encounter: Payer: Self-pay | Admitting: Obstetrics & Gynecology

## 2018-01-16 ENCOUNTER — Ambulatory Visit (INDEPENDENT_AMBULATORY_CARE_PROVIDER_SITE_OTHER): Payer: Medicaid Other | Admitting: Obstetrics & Gynecology

## 2018-01-16 ENCOUNTER — Other Ambulatory Visit (HOSPITAL_COMMUNITY): Payer: Self-pay | Admitting: *Deleted

## 2018-01-16 VITALS — BP 122/88 | HR 85 | Wt 186.0 lb

## 2018-01-16 DIAGNOSIS — Z362 Encounter for other antenatal screening follow-up: Secondary | ICD-10-CM

## 2018-01-16 DIAGNOSIS — Z3483 Encounter for supervision of other normal pregnancy, third trimester: Secondary | ICD-10-CM

## 2018-01-16 DIAGNOSIS — Z302 Encounter for sterilization: Secondary | ICD-10-CM

## 2018-01-16 DIAGNOSIS — Z23 Encounter for immunization: Secondary | ICD-10-CM

## 2018-01-16 DIAGNOSIS — Z98891 History of uterine scar from previous surgery: Secondary | ICD-10-CM

## 2018-01-16 DIAGNOSIS — Z3482 Encounter for supervision of other normal pregnancy, second trimester: Secondary | ICD-10-CM

## 2018-01-16 DIAGNOSIS — O09219 Supervision of pregnancy with history of pre-term labor, unspecified trimester: Secondary | ICD-10-CM | POA: Insufficient documentation

## 2018-01-16 NOTE — Progress Notes (Signed)
PRENATAL VISIT NOTE  Subjective:  Robin Marks is a 37 y.o. [phone] at [redacted]w[redacted]d being seen today for ongoing prenatal care.  She is currently monitored for the following issues for this low-risk pregnancy and has Supervision of normal intrauterine pregnancy in multigravida in third trimester; AMA (advanced maternal age) multigravida 35+; History of cesarean section x 3; Failed BTS- Filshie; Late prenatal care; and Previous possible preterm delivery, antepartum on their problem list.  Patient reports no complaints.  Contractions: Not present. Vag. Bleeding: None.  Movement: Present. Denies leaking of fluid.   The following portions of the patient's history were reviewed and updated as appropriate: allergies, current medications, past family history, past medical history, past social history, past surgical history and problem list. Problem list updated.  Objective:   Vitals:   01/16/18 0903  BP: 122/88  Pulse: 85  Weight: 186 lb (84.4 kg)    Fetal Status: Fetal Heart Rate (bpm): 150 Fundal Height: 29 cm Movement: Present     General:  Alert, oriented and cooperative. Patient is in no acute distress.  Skin: Skin is warm and dry. No rash noted.   Cardiovascular: Normal heart rate noted  Respiratory: Normal respiratory effort, no problems with respiration noted  Abdomen: Soft, gravid, appropriate for gestational age.  Pain/Pressure: Absent     Pelvic: Cervical exam deferred        Extremities: Normal range of motion.     Mental Status: Normal mood and affect. Normal behavior. Normal judgment and thought content.   [place] Mfm Ob Detail +14 Wk  Result Date: 12/17/2017 ----------------------------------------------------------------------  OBSTETRICS REPORT                      (Signed Final 12/17/2017 03:19 pm) ---------------------------------------------------------------------- Patient Info  ID #:       [id number]                          D.O.B.:  August 22, 1980 (37 yrs)  Name:       Robin Marks                Visit Date: 12/17/2017 02:12 pm ---------------------------------------------------------------------- Performed By  Performed By:     Emeline Darling BS,      Ref. Address:     [address]                                                             [place], [place]  [address]  Attending:        Charlsie Merles MD         Location:         [facility]  Referred By:      Hermina Staggers                    MD ---------------------------------------------------------------------- Orders   #  Description                                 Code   1  [place] MFM OB DETAIL +14 WK                     76811.01  ----------------------------------------------------------------------   #  Ordered By               Order #        Accession #    Episode #   1  Nettie Elm            [id number]      [id number]     [id number]  ---------------------------------------------------------------------- Indications   [redacted] weeks gestation of pregnancy                Z3A.26   Encounter for antenatal screening for          Z36.3   malformations   Obesity complicating pregnancy, second         O99.212   trimester   Previous cesarean delivery, antepartum x3      O34.219   Encounter for uncertain dates                  Z36.87   Poor obstetric history: Previous preterm       O09.219   delivery, antepartum (36-37)   Advanced maternal age multigravida 41+,        O17.522   second trimester(Low Risk NIPS)   Late prenatal care, second trimester           O09.32  ---------------------------------------------------------------------- OB History  Blood Type:            Height:  4'11"  Weight (lb):  184       BMI:  37.16  Gravidity:    4         Term:   2        Prem:   1        SAB:   0  TOP:          0       Ectopic:  0        Living: 3  ---------------------------------------------------------------------- Fetal Evaluation  Num Of Fetuses:     1  Fetal Heart         150  Rate(bpm):  Cardiac Activity:   Observed  Presentation:       Variable  Placenta:           Anterior, above cervical os  P. Cord Insertion:  Visualized  Amniotic Fluid  AFI FV:      Subjectively within normal limits                              Largest Pocket(cm)  7.2 ---------------------------------------------------------------------- Biometry  BPD:      62.9  mm     G. Age:  25w 3d         19  %    CI:        67.48   %    70 - 86                                                          FL/HC:      20.0   %    18.6 - 20.4  HC:      245.1  mm     G. Age:  26w 4d         41  %    HC/AC:      1.16        1.04 - 1.22  AC:      211.7  mm     G. Age:  25w 5d         28  %    FL/BPD:     77.9   %    71 - 87  FL:         49  mm     G. Age:  26w 4d         47  %    FL/AC:      23.1   %    20 - 24  HUM:      46.1  mm     G. Age:  27w 1d         70  %  Est. FW:     886  gm    1 lb 15 oz      52  % ---------------------------------------------------------------------- Gestational Age  LMP:           23w 4d        Date:  07/05/17                 EDD:   04/11/18  U/S Today:     26w 1d                                        EDD:   03/24/18  Best:          26w 1d     Det. By:  U/S (12/17/17)           EDD:   03/24/18 ---------------------------------------------------------------------- Anatomy  Cranium:               Appears normal         Aortic Arch:            Not well visualized  Cavum:                 Appears normal         Ductal Arch:            Not well visualized  Ventricles:            Appears normal         Diaphragm:              Appears normal  Choroid Plexus:  Appears normal         Stomach:                Appears normal, left                                                                        sided  Cerebellum:            Appears normal          Abdomen:                Appears normal  Posterior Fossa:       Appears normal         Abdominal Wall:         Appears nml (cord                                                                        insert, abd wall)  Nuchal Fold:           Not applicable (>20    Cord Vessels:           Appears normal ([redacted]                         wks GA)                                        vessel cord)  Face:                  Not well visualized    Kidneys:                Appear normal  Lips:                  Not well visualized    Bladder:                Appears normal  Thoracic:              Appears normal         Spine:                  Appears normal  Heart:                 Not well visualized    Upper Extremities:      Appears normal  RVOT:                  Not well visualized    Lower Extremities:      Appears normal  LVOT:                  Not well visualized  Other:  Female gender. Heels visualized. Technically difficult due to maternal          habitus and fetal position. ---------------------------------------------------------------------- Cervix Uterus Adnexa  Cervix  Length:            4.3  cm.  Normal appearance by transabdominal scan. ---------------------------------------------------------------------- Impression  Singleton intrauterine pregnancy at 26+1 weeks with AMA  and late [facility] here for anatomic survey  Review of the anatomy shows no sonographic markers for  aneuploidy or structural anomalies  However, views of the fetal face and heart should be  considered suboptimal secondary to maternal body habitus  and fetal position  Amniotic fluid volume is normal  Estimated fetal weight shows growth in the 52nd percentile ---------------------------------------------------------------------- Recommendations  Recommend follow-up ultrasound examination in 4 weeks for  completion of the anatomic survey ----------------------------------------------------------------------                 Charlsie Merles, MD Electronically  Signed Final Report   12/17/2017 03:19 pm ----------------------------------------------------------------------  [place] Mfm Ob Follow Up  Result Date: 01/15/2018 ----------------------------------------------------------------------  OBSTETRICS REPORT                      (Signed Final 01/15/2018 03:58 pm) ---------------------------------------------------------------------- Patient Info  ID #:       [id number]                          D.O.B.:  Feb 21, 1981 (37 yrs)  Name:       Robin Marks                Visit Date: 01/15/2018 02:58 pm ---------------------------------------------------------------------- Performed By  Performed By:     Benjamine Mola Vics             Ref. Address:     [address]                                                             [place], [place]                                                             [address]  Attending:        Noralee Space MD        Location:         [facility]  Referred By:      Hermina Staggers                    MD ---------------------------------------------------------------------- Orders   #  Description                                 Code   1  [place] MFM OB FOLLOW UP                         [id number]  ----------------------------------------------------------------------   #  Ordered By               Order #        Accession #    Episode #   1  Scheryl Darter             [id number]      [id number]     [id number]  ---------------------------------------------------------------------- Indications   [redacted] weeks gestation of pregnancy                Z3A.30   Obesity complicating pregnancy, second         O99.212   trimester   Previous cesarean delivery, antepartum x3      O34.219   Poor obstetric history: Previous preterm       O09.219   delivery, antepartum (36-37)   Advanced maternal age multigravida 60+,        O47.522   second trimester(Low Risk NIPS)   Late prenatal care, second  trimester           O09.32   Encounter for other antenatal screening        Z36.2   follow-up  ---------------------------------------------------------------------- OB History  Blood Type:            Height:  4'11"  Weight (lb):  184       BMI:  37.16  Gravidity:    4         Term:   2        Prem:   1        SAB:   0  TOP:          0       Ectopic:  0        Living: 3 ---------------------------------------------------------------------- Fetal Evaluation  Num Of Fetuses:     1  Fetal Heart         143  Rate(bpm):  Cardiac Activity:   Observed  Presentation:       Breech  Placenta:           Anterior  P. Cord Insertion:  Previously Visualized  Amniotic Fluid  AFI FV:      Subjectively within normal limits  AFI Sum(cm)     %Tile       Largest Pocket(cm)  13.89           45          4.72  RUQ(cm)       RLQ(cm)       LUQ(cm)        LLQ(cm)  4.72          3.85          2.35           2.97 ---------------------------------------------------------------------- Biometry  BPD:      72.1  mm     G. Age:  29w 0d          7  %    CI:        73.41   %    70 - 86  FL/HC:      22.1   %    19.2 - 21.4  HC:      267.4  mm     G. Age:  29w 1d        < 3  %    HC/AC:      1.06        0.99 - 1.21  AC:      251.4  mm     G. Age:  29w 2d         20  %    FL/BPD:     81.8   %    71 - 87  FL:         59  mm     G. Age:  30w 6d         50  %    FL/AC:      23.5   %    20 - 24  HUM:      53.3  mm     G. Age:  31w 0d         67  %  Est. FW:    1450  gm      3 lb 3 oz     43  % ---------------------------------------------------------------------- Gestational Age  LMP:           27w 5d        Date:  07/05/17                 EDD:   04/11/18  U/S Today:     29w 4d                                        EDD:   03/29/18  Best:          30w 2d     Det. By:  U/S  (12/17/17)          EDD:   03/24/18 ---------------------------------------------------------------------- Anatomy  Cranium:                Appears normal         Aortic Arch:            Appears normal  Cavum:                 Previously seen        Ductal Arch:            Not well visualized  Ventricles:            Previously seen        Diaphragm:              Previously seen  Choroid Plexus:        Previously seen        Stomach:                Appears normal, left                                                                        sided  Cerebellum:  Previously seen        Abdomen:                Previously seen  Posterior Fossa:       Previously seen        Abdominal Wall:         Previously seen  Nuchal Fold:           Not applicable (>20    Cord Vessels:           Previously seen                         wks GA)  Face:                  Appears normal         Kidneys:                Appear normal                         (orbits and profile)  Lips:                  Appears normal         Bladder:                Appears normal  Thoracic:              Appears normal         Spine:                  Previously seen  Heart:                 Appears normal         Upper Extremities:      Previously seen                         (4CH, axis, and situs  RVOT:                  Not well visualized    Lower Extremities:      Previously seen                         (normal 3VV)  LVOT:                  Appears normal  Other:  Female gender prev seen. Heels prev visualized. Technically difficult          due to maternal habitus and fetal position. ---------------------------------------------------------------------- Cervix Uterus Adnexa  Cervix  Not visualized (advanced GA >29wks) ---------------------------------------------------------------------- Impression  Patient returned for completion of fetal anatomy. Amniotic  fluid is normal and good fetal activity is seen. Fetal growth is  appropriate for gestational age.  Fetal face and cardiac anatomy appear normal.  Maternal obesity imposes limitations on the resolution of  images, and failure to  detect fetal anomalies is more common  in obese pregnant women. As maternal obesity makes  clinical assessment of fetal growth difficult, we recommend a  follow-up  growth scan. ---------------------------------------------------------------------- Recommendations  An appointment was made for her to return in 4 weeks for  fetal growth assessment. ----------------------------------------------------------------------                  Noralee Space, MD Electronically Signed Final Report   01/15/2018 03:58 pm ----------------------------------------------------------------------  Results for orders placed or performed in visit on 01/07/18 (from the past 504 hour(s))  Glucose Tolerance, 2 Hours w/1 Hour   Collection Time: 01/07/18 10:20 AM  Result Value Ref Range   Glucose, Fasting 81 65 - 91 mg/dL   Glucose, 1 hour 147 65 - 179 mg/dL   Glucose, 2 hour 829 65 - 152 mg/dL  CBC   Collection Time: 01/07/18 10:20 AM  Result Value Ref Range   WBC 7.3 3.4 - 10.8 x10E3/uL   RBC 3.70 (L) 3.77 - 5.28 x10E6/uL   Hemoglobin 10.8 (L) 11.1 - 15.9 g/dL   Hematocrit 56.2 (L) 13.0 - 46.6 %   MCV 90 79 - 97 fL   MCH 29.2 26.6 - 33.0 pg   MCHC 32.3 31.5 - 35.7 g/dL   RDW 86.5 78.4 - 69.6 %   Platelets 232 150 - 450 x10E3/uL  RPR   Collection Time: 01/07/18 10:20 AM  Result Value Ref Range   RPR Ser Ql Non Reactive Non Reactive  HIV antibody   Collection Time: 01/07/18 10:20 AM  Result Value Ref Range   HIV Screen 4th Generation wRfx Non Reactive Non Reactive    Assessment and Plan:  Pregnancy: [id number] at [redacted]w[redacted]d  1. History of cesarean section x 3 2. Request for sterilization; had failed Filshie BTL Will need RCS and BTL (salpingectomy recommended due to failed Filshie) at 39 weeks. BTL papers signed today.  3. Need for Tdap vaccination - Tdap vaccine greater than or equal to 7yo IM  4. Supervision of normal intrauterine pregnancy in multigravida in third trimester Reviewed labs and imaging  results. Preterm labor symptoms and general obstetric precautions including but not limited to vaginal bleeding, contractions, leaking of fluid and fetal movement were reviewed in detail with the patient. Please refer to After Visit Summary for other counseling recommendations.  Return in about 2 weeks (around 01/30/2018) for OB Visit.  Future Appointments  Date Time Provider Department Center  02/26/2018  8:15 AM [facility] [place] 4 [facility]    Jaynie Collins, MD

## 2018-01-16 NOTE — Patient Instructions (Signed)
Return to clinic for any scheduled appointments or obstetric concerns, or go to [facility] for evaluation  Research childbirth classes and hospital preregistration at [email or web address]  Fetal Movement Counts Patient Name: ________________________________________________ Patient Due Date: ____________________ What is a fetal movement count? A fetal movement count is the number of times that you feel your baby move during a certain amount of time. This may also be called a fetal kick count. A fetal movement count is recommended for every pregnant woman. You may be asked to start counting fetal movements as early as week 28 of your pregnancy. Pay attention to when your baby is most active. You may notice your baby's sleep and wake cycles. You may also notice things that make your baby move more. You should do a fetal movement count:  When your baby is normally most active.  At the same time each day.  A good time to count movements is while you are resting, after having something to eat and drink. How do I count fetal movements? 1. Find a quiet, comfortable area. Sit, or lie down on your side. 2. Write down the date, the start time and stop time, and the number of movements that you felt between those two times. Take this information with you to your health care visits. 3. For 2 hours, count kicks, flutters, swishes, rolls, and jabs. You should feel at least 10 movements during 2 hours. 4. You may stop counting after you have felt 10 movements. 5. If you do not feel 10 movements in 2 hours, have something to eat and drink. Then, keep resting and counting for 1 hour. If you feel at least 4 movements during that hour, you may stop counting. Contact a health care provider if:  You feel fewer than 4 movements in 2 hours.  Your baby is not moving like he or she usually does. Date: ____________ Start time: ____________ Stop time: ____________ Movements: ____________ Date: ____________ Start time:  ____________ Stop time: ____________ Movements: ____________ Date: ____________ Start time: ____________ Stop time: ____________ Movements: ____________ Date: ____________ Start time: ____________ Stop time: ____________ Movements: ____________ Date: ____________ Start time: ____________ Stop time: ____________ Movements: ____________ Date: ____________ Start time: ____________ Stop time: ____________ Movements: ____________ Date: ____________ Start time: ____________ Stop time: ____________ Movements: ____________ Date: ____________ Start time: ____________ Stop time: ____________ Movements: ____________ Date: ____________ Start time: ____________ Stop time: ____________ Movements: ____________ This information is not intended to replace advice given to you by your health care provider. Make sure you discuss any questions you have with your health care provider. Document Released: 07/19/2006 Document Revised: 02/16/2016 Document Reviewed: 07/29/2015 Elsevier Interactive Patient Education  2018 [employer]  Braxton Hicks Contractions Contractions of the uterus can occur throughout pregnancy, but they are not always a sign that you are in labor. You may have practice contractions called Braxton Hicks contractions. These false labor contractions are sometimes confused with true labor. What are Braxton Hicks contractions? Braxton Hicks contractions are tightening movements that occur in the muscles of the uterus before labor. Unlike true labor contractions, these contractions do not result in opening (dilation) and thinning of the cervix. Toward the end of pregnancy (32-34 weeks), Braxton Hicks contractions can happen more often and may become stronger. These contractions are sometimes difficult to tell apart from true labor because they can be very uncomfortable. You should not feel embarrassed if you go to the hospital with false labor. Sometimes, the only way to tell if you are in true labor is    for your health care provider to look for changes in the cervix. The health care provider will do a physical exam and may monitor your contractions. If you are not in true labor, the exam should show that your cervix is not dilating and your water has not broken. If there are other health problems associated with your pregnancy, it is completely safe for you to be sent home with false labor. You may continue to have Braxton Hicks contractions until you go into true labor. How to tell the difference between true labor and false labor True labor  Contractions last 30-70 seconds.  Contractions become very regular.  Discomfort is usually felt in the top of the uterus, and it spreads to the lower abdomen and low back.  Contractions do not go away with walking.  Contractions usually become more intense and increase in frequency.  The cervix dilates and gets thinner. False labor  Contractions are usually shorter and not as strong as true labor contractions.  Contractions are usually irregular.  Contractions are often felt in the front of the lower abdomen and in the groin.  Contractions may go away when you walk around or change positions while lying down.  Contractions get weaker and are shorter-lasting as time goes on.  The cervix usually does not dilate or become thin. Follow these instructions at home:  Take over-the-counter and prescription medicines only as told by your health care provider.  Keep up with your usual exercises and follow other instructions from your health care provider.  Eat and drink lightly if you think you are going into labor.  If Braxton Hicks contractions are making you uncomfortable: ? Change your position from lying down or resting to walking, or change from walking to resting. ? Sit and rest in a tub of warm water. ? Drink enough fluid to keep your urine pale yellow. Dehydration may cause these contractions. ? Do slow and deep breathing several times  an hour.  Keep all follow-up prenatal visits as told by your health care provider. This is important. Contact a health care provider if:  You have a fever.  You have continuous pain in your abdomen. Get help right away if:  Your contractions become stronger, more regular, and closer together.  You have fluid leaking or gushing from your vagina.  You pass blood-tinged mucus (bloody show).  You have bleeding from your vagina.  You have low back pain that you never had before.  You feel your baby's head pushing down and causing pelvic pressure.  Your baby is not moving inside you as much as it used to. Summary  Contractions that occur before labor are called Braxton Hicks contractions, false labor, or practice contractions.  Braxton Hicks contractions are usually shorter, weaker, farther apart, and less regular than true labor contractions. True labor contractions usually become progressively stronger and regular and they become more frequent.  Manage discomfort from Braxton Hicks contractions by changing position, resting in a warm bath, drinking plenty of water, or practicing deep breathing. This information is not intended to replace advice given to you by your health care provider. Make sure you discuss any questions you have with your health care provider. Document Released: 11/02/2016 Document Revised: 11/02/2016 Document Reviewed: 11/02/2016 Elsevier Interactive Patient Education  2018 [employer]    

## 2018-01-30 ENCOUNTER — Ambulatory Visit (INDEPENDENT_AMBULATORY_CARE_PROVIDER_SITE_OTHER): Payer: Medicaid Other | Admitting: Advanced Practice Midwife

## 2018-01-30 VITALS — BP 122/83 | HR 94 | Wt 189.6 lb

## 2018-01-30 DIAGNOSIS — Z302 Encounter for sterilization: Secondary | ICD-10-CM

## 2018-01-30 DIAGNOSIS — Z3483 Encounter for supervision of other normal pregnancy, third trimester: Secondary | ICD-10-CM

## 2018-01-30 DIAGNOSIS — O368130 Decreased fetal movements, third trimester, not applicable or unspecified: Secondary | ICD-10-CM

## 2018-01-30 DIAGNOSIS — O34219 Maternal care for unspecified type scar from previous cesarean delivery: Secondary | ICD-10-CM

## 2018-01-30 DIAGNOSIS — Z98891 History of uterine scar from previous surgery: Secondary | ICD-10-CM

## 2018-01-30 NOTE — Progress Notes (Signed)
   PRENATAL VISIT NOTE  Subjective:  Robin Marks is a 37 y.o. [id number] at 2114w6d being seen today for ongoing prenatal care.  She is currently monitored for the following issues for this low-risk pregnancy and has Supervision of normal intrauterine pregnancy in multigravida in third trimester; AMA (advanced maternal age) multigravida 35+; History of cesarean section x 3; Failed BTS- Filshie; Late prenatal care; and Previous possible preterm delivery, antepartum on their problem list.  Patient reports good fetal movement earlier this morning but less for the last 2 hours.  Contractions: Not present. Vag. Bleeding: None.  Movement: Present. Denies leaking of fluid.   The following portions of the patient's history were reviewed and updated as appropriate: allergies, current medications, past family history, past medical history, past social history, past surgical history and problem list. Problem list updated.  Objective:   Vitals:   01/30/18 0900  BP: 122/83  Pulse: 94  Weight: 189 lb 9.6 oz (86 kg)    Fetal Status:     Movement: Present     General:  Alert, oriented and cooperative. Patient is in no acute distress.  Skin: Skin is warm and dry. No rash noted.   Cardiovascular: Normal heart rate noted  Respiratory: Normal respiratory effort, no problems with respiration noted  Abdomen: Soft, gravid, appropriate for gestational age.  Pain/Pressure: Absent     Pelvic: Cervical exam deferred        Extremities: Normal range of motion.  Edema: None  Mental Status: Normal mood and affect. Normal behavior. Normal judgment and thought content.   Assessment and Plan:  Pregnancy: [id number] at 4114w6d  1. History of cesarean section --x3, plans repeat   2. Request for sterilization; had failed Filshie BTL --Salpingectomy with RLTCS  3. Decreased fetal movements in third trimester, single or unspecified fetus --Difficulty obtaining doppler and slightly decreased fetal movement today. --NST  done in office and reactive.  Pt reassured.  Resume routine care.  --Anticipatory guidance about next visits/weeks of pregnancy given.   Preterm labor symptoms and general obstetric precautions including but not limited to vaginal bleeding, contractions, leaking of fluid and fetal movement were reviewed in detail with the patient. Please refer to After Visit Summary for other counseling recommendations.  Return in about 2 weeks (around 02/13/2018).  Future Appointments  Date Time Provider Department Center  02/26/2018  8:15 AM [facility] US 4 [facility]    Sharen CounterLisa Leftwich-Kirby, CNM

## 2018-01-30 NOTE — Patient Instructions (Signed)
Third Trimester of Pregnancy The third trimester is from week 28 through week 40 (months 7 through 9). The third trimester is a time when the unborn baby (fetus) is growing rapidly. At the end of the ninth month, the fetus is about 20 inches in length and weighs 6-10 pounds. Body changes during your third trimester Your body will continue to go through many changes during pregnancy. The changes vary from woman to woman. During the third trimester:  Your weight will continue to increase. You can expect to gain 25-35 pounds (11-16 kg) by the end of the pregnancy.  You may begin to get stretch marks on your hips, abdomen, and breasts.  You may urinate more often because the fetus is moving lower into your pelvis and pressing on your bladder.  You may develop or continue to have heartburn. This is caused by increased hormones that slow down muscles in the digestive tract.  You may develop or continue to have constipation because increased hormones slow digestion and cause the muscles that push waste through your intestines to relax.  You may develop hemorrhoids. These are swollen veins (varicose veins) in the rectum that can itch or be painful.  You may develop swollen, bulging veins (varicose veins) in your legs.  You may have increased body aches in the pelvis, back, or thighs. This is due to weight gain and increased hormones that are relaxing your joints.  You may have changes in your hair. These can include thickening of your hair, rapid growth, and changes in texture. Some women also have hair loss during or after pregnancy, or hair that feels dry or thin. Your hair will most likely return to normal after your baby is born.  Your breasts will continue to grow and they will continue to become tender. A yellow fluid (colostrum) may leak from your breasts. This is the first milk you are producing for your baby.  Your belly button may stick out.  You may notice more swelling in your hands,  face, or ankles.  You may have increased tingling or numbness in your hands, arms, and legs. The skin on your belly may also feel numb.  You may feel short of breath because of your expanding uterus.  You may have more problems sleeping. This can be caused by the size of your belly, increased need to urinate, and an increase in your body's metabolism.  You may notice the fetus "dropping," or moving lower in your abdomen (lightening).  You may have increased vaginal discharge.  You may notice your joints feel loose and you may have pain around your pelvic bone.  What to expect at prenatal visits You will have prenatal exams every 2 weeks until week 36. Then you will have weekly prenatal exams. During a routine prenatal visit:  You will be weighed to make sure you and the baby are growing normally.  Your blood pressure will be taken.  Your abdomen will be measured to track your baby's growth.  The fetal heartbeat will be listened to.  Any test results from the previous visit will be discussed.  You may have a cervical check near your due date to see if your cervix has softened or thinned (effaced).  You will be tested for Group B streptococcus. This happens between 35 and 37 weeks.  Your health care provider may ask you:  What your birth plan is.  How you are feeling.  If you are feeling the baby move.  If you have had   any abnormal symptoms, such as leaking fluid, bleeding, severe headaches, or abdominal cramping.  If you are using any tobacco products, including cigarettes, chewing tobacco, and electronic cigarettes.  If you have any questions.  Other tests or screenings that may be performed during your third trimester include:  Blood tests that check for low iron levels (anemia).  Fetal testing to check the health, activity level, and growth of the fetus. Testing is done if you have certain medical conditions or if there are problems during the  pregnancy.  Nonstress test (NST). This test checks the health of your baby to make sure there are no signs of problems, such as the baby not getting enough oxygen. During this test, a belt is placed around your belly. The baby is made to move, and its heart rate is monitored during movement.  What is false labor? False labor is a condition in which you feel small, irregular tightenings of the muscles in the womb (contractions) that usually go away with rest, changing position, or drinking water. These are called Braxton Hicks contractions. Contractions may last for hours, days, or even weeks before true labor sets in. If contractions come at regular intervals, become more frequent, increase in intensity, or become painful, you should see your health care provider. What are the signs of labor?  Abdominal cramps.  Regular contractions that start at 10 minutes apart and become stronger and more frequent with time.  Contractions that start on the top of the uterus and spread down to the lower abdomen and back.  Increased pelvic pressure and dull back pain.  A watery or bloody mucus discharge that comes from the vagina.  Leaking of amniotic fluid. This is also known as your "water breaking." It could be a slow trickle or a gush. Let your health care provider know if it has a color or strange odor. If you have any of these signs, call your health care provider right away, even if it is before your due date. Follow these instructions at home: Medicines  Follow your health care provider's instructions regarding medicine use. Specific medicines may be either safe or unsafe to take during pregnancy.  Take a prenatal vitamin that contains at least 600 micrograms (mcg) of folic acid.  If you develop constipation, try taking a stool softener if your health care provider approves. Eating and drinking  Eat a balanced diet that includes fresh fruits and vegetables, whole grains, good sources of protein  such as meat, eggs, or tofu, and low-fat dairy. Your health care provider will help you determine the amount of weight gain that is right for you.  Avoid raw meat and uncooked cheese. These carry germs that can cause birth defects in the baby.  If you have low calcium intake from food, talk to your health care provider about whether you should take a daily calcium supplement.  Eat four or five small meals rather than three large meals a day.  Limit foods that are high in fat and processed sugars, such as fried and sweet foods.  To prevent constipation: ? Drink enough fluid to keep your urine clear or pale yellow. ? Eat foods that are high in fiber, such as fresh fruits and vegetables, whole grains, and beans. Activity  Exercise only as directed by your health care provider. Most women can continue their usual exercise routine during pregnancy. Try to exercise for 30 minutes at least 5 days a week. Stop exercising if you experience uterine contractions.  Avoid heavy   lifting.  Do not exercise in extreme heat or humidity, or at high altitudes.  Wear low-heel, comfortable shoes.  Practice good posture.  You may continue to have sex unless your health care provider tells you otherwise. Relieving pain and discomfort  Take frequent breaks and rest with your legs elevated if you have leg cramps or low back pain.  Take warm sitz baths to soothe any pain or discomfort caused by hemorrhoids. Use hemorrhoid cream if your health care provider approves.  Wear a good support bra to prevent discomfort from breast tenderness.  If you develop varicose veins: ? Wear support pantyhose or compression stockings as told by your healthcare provider. ? Elevate your feet for 15 minutes, 3-4 times a day. Prenatal care  Write down your questions. Take them to your prenatal visits.  Keep all your prenatal visits as told by your health care provider. This is important. Safety  Wear your seat belt at  all times when driving.  Make a list of emergency phone numbers, including numbers for family, friends, the hospital, and police and fire departments. General instructions  Avoid cat litter boxes and soil used by cats. These carry germs that can cause birth defects in the baby. If you have a cat, ask someone to clean the litter box for you.  Do not travel far distances unless it is absolutely necessary and only with the approval of your health care provider.  Do not use hot tubs, steam rooms, or saunas.  Do not drink alcohol.  Do not use any products that contain nicotine or tobacco, such as cigarettes and e-cigarettes. If you need help quitting, ask your health care provider.  Do not use any medicinal herbs or unprescribed drugs. These chemicals affect the formation and growth of the baby.  Do not douche or use tampons or scented sanitary pads.  Do not cross your legs for long periods of time.  To prepare for the arrival of your baby: ? Take prenatal classes to understand, practice, and ask questions about labor and delivery. ? Make a trial run to the hospital. ? Visit the hospital and tour the maternity area. ? Arrange for maternity or paternity leave through employers. ? Arrange for family and friends to take care of pets while you are in the hospital. ? Purchase a rear-facing car seat and make sure you know how to install it in your car. ? Pack your hospital bag. ? Prepare the baby's nursery. Make sure to remove all pillows and stuffed animals from the baby's crib to prevent suffocation.  Visit your dentist if you have not gone during your pregnancy. Use a soft toothbrush to brush your teeth and be gentle when you floss. Contact a health care provider if:  You are unsure if you are in labor or if your water has broken.  You become dizzy.  You have mild pelvic cramps, pelvic pressure, or nagging pain in your abdominal area.  You have lower back pain.  You have persistent  nausea, vomiting, or diarrhea.  You have an unusual or bad smelling vaginal discharge.  You have pain when you urinate. Get help right away if:  Your water breaks before 37 weeks.  You have regular contractions less than 5 minutes apart before 37 weeks.  You have a fever.  You are leaking fluid from your vagina.  You have spotting or bleeding from your vagina.  You have severe abdominal pain or cramping.  You have rapid weight loss or weight gain.    You have shortness of breath with chest pain.  You notice sudden or extreme swelling of your face, hands, ankles, feet, or legs.  Your baby makes fewer than 10 movements in 2 hours.  You have severe headaches that do not go away when you take medicine.  You have vision changes. Summary  The third trimester is from week 28 through week 40, months 7 through 9. The third trimester is a time when the unborn baby (fetus) is growing rapidly.  During the third trimester, your discomfort may increase as you and your baby continue to gain weight. You may have abdominal, leg, and back pain, sleeping problems, and an increased need to urinate.  During the third trimester your breasts will keep growing and they will continue to become tender. A yellow fluid (colostrum) may leak from your breasts. This is the first milk you are producing for your baby.  False labor is a condition in which you feel small, irregular tightenings of the muscles in the womb (contractions) that eventually go away. These are called Braxton Hicks contractions. Contractions may last for hours, days, or even weeks before true labor sets in.  Signs of labor can include: abdominal cramps; regular contractions that start at 10 minutes apart and become stronger and more frequent with time; watery or bloody mucus discharge that comes from the vagina; increased pelvic pressure and dull back pain; and leaking of amniotic fluid. This information is not intended to replace advice  given to you by your health care provider. Make sure you discuss any questions you have with your health care provider. Document Released: 06/13/2001 Document Revised: 11/25/2015 Document Reviewed: 08/20/2012 Elsevier Interactive Patient Education  2017 [employer]  

## 2018-02-13 ENCOUNTER — Encounter (HOSPITAL_COMMUNITY): Payer: Self-pay

## 2018-02-13 ENCOUNTER — Ambulatory Visit (INDEPENDENT_AMBULATORY_CARE_PROVIDER_SITE_OTHER): Payer: Medicaid Other | Admitting: Obstetrics and Gynecology

## 2018-02-13 ENCOUNTER — Encounter: Payer: Self-pay | Admitting: Obstetrics and Gynecology

## 2018-02-13 VITALS — BP 118/82 | HR 77 | Wt 187.2 lb

## 2018-02-13 DIAGNOSIS — Z98891 History of uterine scar from previous surgery: Secondary | ICD-10-CM

## 2018-02-13 DIAGNOSIS — Z3483 Encounter for supervision of other normal pregnancy, third trimester: Secondary | ICD-10-CM

## 2018-02-13 DIAGNOSIS — O09523 Supervision of elderly multigravida, third trimester: Secondary | ICD-10-CM

## 2018-02-13 DIAGNOSIS — Z302 Encounter for sterilization: Secondary | ICD-10-CM

## 2018-02-13 NOTE — Progress Notes (Signed)
Patient reports good fetal movement, denies pain. 

## 2018-02-13 NOTE — Progress Notes (Signed)
Subjective:  Robin Marks is a 37 y.o. [phone] at 7132w6d being seen today for ongoing prenatal care.  She is currently monitored for the following issues for this high-risk pregnancy and has Supervision of normal intrauterine pregnancy in multigravida in third trimester; AMA (advanced maternal age) multigravida 35+; History of cesarean section x 3; Failed BTS- Filshie; Late prenatal care; and Previous possible preterm delivery, antepartum on their problem list.  Patient reports no complaints.  Contractions: Not present. Vag. Bleeding: None.  Movement: Present. Denies leaking of fluid.   The following portions of the patient's history were reviewed and updated as appropriate: allergies, current medications, past family history, past medical history, past social history, past surgical history and problem list. Problem list updated.  Objective:   Vitals:   02/13/18 0927  BP: 118/82  Pulse: 77  Weight: 187 lb 3.2 oz (84.9 kg)    Fetal Status: Fetal Heart Rate (bpm): 140   Movement: Present     General:  Alert, oriented and cooperative. Patient is in no acute distress.  Skin: Skin is warm and dry. No rash noted.   Cardiovascular: Normal heart rate noted  Respiratory: Normal respiratory effort, no problems with respiration noted  Abdomen: Soft, gravid, appropriate for gestational age. Pain/Pressure: Absent     Pelvic:  Cervical exam deferred        Extremities: Normal range of motion.  Edema: None  Mental Status: Normal mood and affect. Normal behavior. Normal judgment and thought content.   Urinalysis:      Assessment and Plan:  Pregnancy: [id number] at 4632w6d  1. Supervision of normal intrauterine pregnancy in multigravida in third trimester Stable  2. Multigravida of advanced maternal age in third trimester   3. History of cesarean section x 3 For repeat with BTL at 39 weeks  4. Failed BTS- Filshie See above  Preterm labor symptoms and general obstetric precautions including  but not limited to vaginal bleeding, contractions, leaking of fluid and fetal movement were reviewed in detail with the patient. Please refer to After Visit Summary for other counseling recommendations.  Return in about 2 weeks (around 02/27/2018) for OB visit.   Hermina StaggersErvin, Michael L, MD

## 2018-02-13 NOTE — Patient Instructions (Signed)
Third Trimester of Pregnancy The third trimester is from week 28 through week 40 (months 7 through 9). The third trimester is a time when the unborn baby (fetus) is growing rapidly. At the end of the ninth month, the fetus is about 20 inches in length and weighs 6-10 pounds. Body changes during your third trimester Your body will continue to go through many changes during pregnancy. The changes vary from woman to woman. During the third trimester:  Your weight will continue to increase. You can expect to gain 25-35 pounds (11-16 kg) by the end of the pregnancy.  You may begin to get stretch marks on your hips, abdomen, and breasts.  You may urinate more often because the fetus is moving lower into your pelvis and pressing on your bladder.  You may develop or continue to have heartburn. This is caused by increased hormones that slow down muscles in the digestive tract.  You may develop or continue to have constipation because increased hormones slow digestion and cause the muscles that push waste through your intestines to relax.  You may develop hemorrhoids. These are swollen veins (varicose veins) in the rectum that can itch or be painful.  You may develop swollen, bulging veins (varicose veins) in your legs.  You may have increased body aches in the pelvis, back, or thighs. This is due to weight gain and increased hormones that are relaxing your joints.  You may have changes in your hair. These can include thickening of your hair, rapid growth, and changes in texture. Some women also have hair loss during or after pregnancy, or hair that feels dry or thin. Your hair will most likely return to normal after your baby is born.  Your breasts will continue to grow and they will continue to become tender. A yellow fluid (colostrum) may leak from your breasts. This is the first milk you are producing for your baby.  Your belly button may stick out.  You may notice more swelling in your hands,  face, or ankles.  You may have increased tingling or numbness in your hands, arms, and legs. The skin on your belly may also feel numb.  You may feel short of breath because of your expanding uterus.  You may have more problems sleeping. This can be caused by the size of your belly, increased need to urinate, and an increase in your body's metabolism.  You may notice the fetus "dropping," or moving lower in your abdomen (lightening).  You may have increased vaginal discharge.  You may notice your joints feel loose and you may have pain around your pelvic bone.  What to expect at prenatal visits You will have prenatal exams every 2 weeks until week 36. Then you will have weekly prenatal exams. During a routine prenatal visit:  You will be weighed to make sure you and the baby are growing normally.  Your blood pressure will be taken.  Your abdomen will be measured to track your baby's growth.  The fetal heartbeat will be listened to.  Any test results from the previous visit will be discussed.  You may have a cervical check near your due date to see if your cervix has softened or thinned (effaced).  You will be tested for Group B streptococcus. This happens between 35 and 37 weeks.  Your health care provider may ask you:  What your birth plan is.  How you are feeling.  If you are feeling the baby move.  If you have had   any abnormal symptoms, such as leaking fluid, bleeding, severe headaches, or abdominal cramping.  If you are using any tobacco products, including cigarettes, chewing tobacco, and electronic cigarettes.  If you have any questions.  Other tests or screenings that may be performed during your third trimester include:  Blood tests that check for low iron levels (anemia).  Fetal testing to check the health, activity level, and growth of the fetus. Testing is done if you have certain medical conditions or if there are problems during the  pregnancy.  Nonstress test (NST). This test checks the health of your baby to make sure there are no signs of problems, such as the baby not getting enough oxygen. During this test, a belt is placed around your belly. The baby is made to move, and its heart rate is monitored during movement.  What is false labor? False labor is a condition in which you feel small, irregular tightenings of the muscles in the womb (contractions) that usually go away with rest, changing position, or drinking water. These are called Braxton Hicks contractions. Contractions may last for hours, days, or even weeks before true labor sets in. If contractions come at regular intervals, become more frequent, increase in intensity, or become painful, you should see your health care provider. What are the signs of labor?  Abdominal cramps.  Regular contractions that start at 10 minutes apart and become stronger and more frequent with time.  Contractions that start on the top of the uterus and spread down to the lower abdomen and back.  Increased pelvic pressure and dull back pain.  A watery or bloody mucus discharge that comes from the vagina.  Leaking of amniotic fluid. This is also known as your "water breaking." It could be a slow trickle or a gush. Let your health care provider know if it has a color or strange odor. If you have any of these signs, call your health care provider right away, even if it is before your due date. Follow these instructions at home: Medicines  Follow your health care provider's instructions regarding medicine use. Specific medicines may be either safe or unsafe to take during pregnancy.  Take a prenatal vitamin that contains at least 600 micrograms (mcg) of folic acid.  If you develop constipation, try taking a stool softener if your health care provider approves. Eating and drinking  Eat a balanced diet that includes fresh fruits and vegetables, whole grains, good sources of protein  such as meat, eggs, or tofu, and low-fat dairy. Your health care provider will help you determine the amount of weight gain that is right for you.  Avoid raw meat and uncooked cheese. These carry germs that can cause birth defects in the baby.  If you have low calcium intake from food, talk to your health care provider about whether you should take a daily calcium supplement.  Eat four or five small meals rather than three large meals a day.  Limit foods that are high in fat and processed sugars, such as fried and sweet foods.  To prevent constipation: ? Drink enough fluid to keep your urine clear or pale yellow. ? Eat foods that are high in fiber, such as fresh fruits and vegetables, whole grains, and beans. Activity  Exercise only as directed by your health care provider. Most women can continue their usual exercise routine during pregnancy. Try to exercise for 30 minutes at least 5 days a week. Stop exercising if you experience uterine contractions.  Avoid heavy   lifting.  Do not exercise in extreme heat or humidity, or at high altitudes.  Wear low-heel, comfortable shoes.  Practice good posture.  You may continue to have sex unless your health care provider tells you otherwise. Relieving pain and discomfort  Take frequent breaks and rest with your legs elevated if you have leg cramps or low back pain.  Take warm sitz baths to soothe any pain or discomfort caused by hemorrhoids. Use hemorrhoid cream if your health care provider approves.  Wear a good support bra to prevent discomfort from breast tenderness.  If you develop varicose veins: ? Wear support pantyhose or compression stockings as told by your healthcare provider. ? Elevate your feet for 15 minutes, 3-4 times a day. Prenatal care  Write down your questions. Take them to your prenatal visits.  Keep all your prenatal visits as told by your health care provider. This is important. Safety  Wear your seat belt at  all times when driving.  Make a list of emergency phone numbers, including numbers for family, friends, the hospital, and police and fire departments. General instructions  Avoid cat litter boxes and soil used by cats. These carry germs that can cause birth defects in the baby. If you have a cat, ask someone to clean the litter box for you.  Do not travel far distances unless it is absolutely necessary and only with the approval of your health care provider.  Do not use hot tubs, steam rooms, or saunas.  Do not drink alcohol.  Do not use any products that contain nicotine or tobacco, such as cigarettes and e-cigarettes. If you need help quitting, ask your health care provider.  Do not use any medicinal herbs or unprescribed drugs. These chemicals affect the formation and growth of the baby.  Do not douche or use tampons or scented sanitary pads.  Do not cross your legs for long periods of time.  To prepare for the arrival of your baby: ? Take prenatal classes to understand, practice, and ask questions about labor and delivery. ? Make a trial run to the hospital. ? Visit the hospital and tour the maternity area. ? Arrange for maternity or paternity leave through employers. ? Arrange for family and friends to take care of pets while you are in the hospital. ? Purchase a rear-facing car seat and make sure you know how to install it in your car. ? Pack your hospital bag. ? Prepare the baby's nursery. Make sure to remove all pillows and stuffed animals from the baby's crib to prevent suffocation.  Visit your dentist if you have not gone during your pregnancy. Use a soft toothbrush to brush your teeth and be gentle when you floss. Contact a health care provider if:  You are unsure if you are in labor or if your water has broken.  You become dizzy.  You have mild pelvic cramps, pelvic pressure, or nagging pain in your abdominal area.  You have lower back pain.  You have persistent  nausea, vomiting, or diarrhea.  You have an unusual or bad smelling vaginal discharge.  You have pain when you urinate. Get help right away if:  Your water breaks before 37 weeks.  You have regular contractions less than 5 minutes apart before 37 weeks.  You have a fever.  You are leaking fluid from your vagina.  You have spotting or bleeding from your vagina.  You have severe abdominal pain or cramping.  You have rapid weight loss or weight gain.    You have shortness of breath with chest pain.  You notice sudden or extreme swelling of your face, hands, ankles, feet, or legs.  Your baby makes fewer than 10 movements in 2 hours.  You have severe headaches that do not go away when you take medicine.  You have vision changes. Summary  The third trimester is from week 28 through week 40, months 7 through 9. The third trimester is a time when the unborn baby (fetus) is growing rapidly.  During the third trimester, your discomfort may increase as you and your baby continue to gain weight. You may have abdominal, leg, and back pain, sleeping problems, and an increased need to urinate.  During the third trimester your breasts will keep growing and they will continue to become tender. A yellow fluid (colostrum) may leak from your breasts. This is the first milk you are producing for your baby.  False labor is a condition in which you feel small, irregular tightenings of the muscles in the womb (contractions) that eventually go away. These are called Braxton Hicks contractions. Contractions may last for hours, days, or even weeks before true labor sets in.  Signs of labor can include: abdominal cramps; regular contractions that start at 10 minutes apart and become stronger and more frequent with time; watery or bloody mucus discharge that comes from the vagina; increased pelvic pressure and dull back pain; and leaking of amniotic fluid. This information is not intended to replace advice  given to you by your health care provider. Make sure you discuss any questions you have with your health care provider. Document Released: 06/13/2001 Document Revised: 11/25/2015 Document Reviewed: 08/20/2012 Elsevier Interactive Patient Education  2017 [employer]  

## 2018-02-26 ENCOUNTER — Ambulatory Visit (HOSPITAL_COMMUNITY)
Admission: RE | Admit: 2018-02-26 | Discharge: 2018-02-26 | Disposition: A | Discharge status: Home / Self Care | Payer: Medicaid Other | Source: Ambulatory Visit | Attending: Obstetrics and Gynecology | Admitting: Obstetrics and Gynecology

## 2018-02-26 DIAGNOSIS — O0933 Supervision of pregnancy with insufficient antenatal care, third trimester: Secondary | ICD-10-CM

## 2018-02-26 DIAGNOSIS — O99213 Obesity complicating pregnancy, third trimester: Secondary | ICD-10-CM

## 2018-02-26 DIAGNOSIS — Z362 Encounter for other antenatal screening follow-up: Secondary | ICD-10-CM

## 2018-02-26 DIAGNOSIS — O34219 Maternal care for unspecified type scar from previous cesarean delivery: Secondary | ICD-10-CM

## 2018-02-26 DIAGNOSIS — O09523 Supervision of elderly multigravida, third trimester: Secondary | ICD-10-CM

## 2018-02-26 DIAGNOSIS — Z3A36 36 weeks gestation of pregnancy: Secondary | ICD-10-CM

## 2018-02-27 ENCOUNTER — Ambulatory Visit (INDEPENDENT_AMBULATORY_CARE_PROVIDER_SITE_OTHER): Payer: Medicaid Other | Admitting: Obstetrics

## 2018-02-27 VITALS — BP 127/87 | HR 83 | Wt 194.0 lb

## 2018-02-27 DIAGNOSIS — Z3483 Encounter for supervision of other normal pregnancy, third trimester: Secondary | ICD-10-CM

## 2018-02-27 NOTE — Progress Notes (Signed)
Pt is G4P3 4027w6d here for ROB visit.

## 2018-02-28 ENCOUNTER — Encounter: Payer: Self-pay | Admitting: Obstetrics

## 2018-02-28 NOTE — Progress Notes (Signed)
Subjective:  Talmage CoinMonisha E Lubrano is a 37 y.o. [id number] at 3546w0d being seen today for ongoing prenatal care.  She is currently monitored for the following issues for this low-risk pregnancy and has Supervision of normal intrauterine pregnancy in multigravida in third trimester; AMA (advanced maternal age) multigravida 35+; History of cesarean section x 3; Failed BTS- [place]; Late prenatal care; and Previous possible preterm delivery, antepartum on their problem list.  Patient reports no complaints.  Contractions: Not present. Vag. Bleeding: None.  Movement: Present. Denies leaking of fluid.   The following portions of the patient's history were reviewed and updated as appropriate: allergies, current medications, past family history, past medical history, past social history, past surgical history and problem list. Problem list updated.  Objective:   Vitals:   02/27/18 1547  BP: 127/87  Pulse: 83  Weight: 194 lb (88 kg)    Fetal Status: Fetal Heart Rate (bpm): 150   Movement: Present     General:  Alert, oriented and cooperative. Patient is in no acute distress.  Skin: Skin is warm and dry. No rash noted.   Cardiovascular: Normal heart rate noted  Respiratory: Normal respiratory effort, no problems with respiration noted  Abdomen: Soft, gravid, appropriate for gestational age. Pain/Pressure: Present     Pelvic:  Cervical exam deferred        Extremities: Normal range of motion.  Edema: None  Mental Status: Normal mood and affect. Normal behavior. Normal judgment and thought content.   Urinalysis:      Assessment and Plan:  Pregnancy: [id number] at 7746w0d  1. Supervision of normal intrauterine pregnancy in multigravida in third trimester   Preterm labor symptoms and general obstetric precautions including but not limited to vaginal bleeding, contractions, leaking of fluid and fetal movement were reviewed in detail with the patient. Please refer to After Visit Summary for other counseling  recommendations.  Return in about 2 weeks (around 03/13/2018) for ROB.   Brock BadHarper, Charles A, MD

## 2018-03-06 ENCOUNTER — Telehealth: Payer: Self-pay | Admitting: *Deleted

## 2018-03-06 NOTE — Telephone Encounter (Signed)
Attempt to contact pt regarding FMLA papers. No answer, no VM.

## 2018-03-13 ENCOUNTER — Encounter: Payer: Medicaid Other | Admitting: Obstetrics

## 2018-03-13 ENCOUNTER — Encounter: Payer: Self-pay | Admitting: Obstetrics and Gynecology

## 2018-03-13 ENCOUNTER — Ambulatory Visit (INDEPENDENT_AMBULATORY_CARE_PROVIDER_SITE_OTHER): Payer: Medicaid Other | Admitting: Obstetrics and Gynecology

## 2018-03-13 ENCOUNTER — Other Ambulatory Visit (HOSPITAL_COMMUNITY)
Admission: RE | Admit: 2018-03-13 | Discharge: 2018-03-13 | Disposition: A | Discharge status: Home / Self Care | Payer: Medicaid Other | Source: Ambulatory Visit | Attending: Obstetrics | Admitting: Obstetrics

## 2018-03-13 VITALS — BP 135/85 | HR 80 | Wt 191.0 lb

## 2018-03-13 DIAGNOSIS — Z302 Encounter for sterilization: Secondary | ICD-10-CM

## 2018-03-13 DIAGNOSIS — Z3483 Encounter for supervision of other normal pregnancy, third trimester: Secondary | ICD-10-CM | POA: Diagnosis present

## 2018-03-13 DIAGNOSIS — Z98891 History of uterine scar from previous surgery: Secondary | ICD-10-CM

## 2018-03-13 DIAGNOSIS — O09523 Supervision of elderly multigravida, third trimester: Secondary | ICD-10-CM

## 2018-03-13 DIAGNOSIS — Z3481 Encounter for supervision of other normal pregnancy, first trimester: Secondary | ICD-10-CM

## 2018-03-13 NOTE — Progress Notes (Signed)
   PRENATAL VISIT NOTE  Subjective:  Robin Marks is a 37 y.o. [phone] at [redacted]w[redacted]d being seen today for ongoing prenatal care.  She is currently monitored for the following issues for this low-risk pregnancy and has Supervision of normal intrauterine pregnancy in multigravida in third trimester; AMA (advanced maternal age) multigravida 35+; History of cesarean section x 3; Failed BTS- Fil[place]ie; Late prenatal care; and Previous possible preterm delivery, antepartum on their problem list.  Patient reports no complaints.  Contractions: Irregular. Vag. Bleeding: None.  Movement: Present. Denies leaking of fluid.   The following portions of the patient's history were reviewed and updated as appropriate: allergies, current medications, past family history, past medical history, past social history, past surgical history and problem list. Problem list updated.  Objective:   Vitals:   03/13/18 1334 03/13/18 1335  BP: (!) 146/101 (!) 135/95  Pulse: 80   Weight: 191 lb (86.6 kg)     Fetal Status:     Movement: Present     General:  Alert, oriented and cooperative. Patient is in no acute distress.  Skin: Skin is warm and dry. No rash noted.   Cardiovascular: Normal heart rate noted  Respiratory: Normal respiratory effort, no problems with respiration noted  Abdomen: Soft, gravid, appropriate for gestational age.  Pain/Pressure: Present     Pelvic: Cervical exam deferred        Extremities: Normal range of motion.  Edema: Trace  Mental Status: Normal mood and affect. Normal behavior. Normal judgment and thought content.   Assessment and Plan:  Pregnancy: [id number] at [redacted]w[redacted]d  1. Supervision of normal intrauterine pregnancy in multigravida in third trimester Patient is doing well without complaints Patient questioned EDC of 04/11/2018 in her chart. Upon chart review EDC was changed to 9/22 - Cervicovaginal ancillary only  2. History of cesarean section x 3 Patient will be scheduled for repeat  at 39 weeks on 9/15 with salpingectomy due to failed Filshie  3. Failed BTS- [place] Scheduled for salpingectomy  4. Multigravida of advanced maternal age in third trimester   Term labor symptoms and general obstetric precautions including but not limited to vaginal bleeding, contractions, leaking of fluid and fetal movement were reviewed in detail with the patient. Please refer to After Visit Summary for other counseling recommendations.  No follow-ups on file.  No future appointments.  Catalina Antigua, MD

## 2018-03-14 ENCOUNTER — Encounter (HOSPITAL_COMMUNITY): Payer: Self-pay

## 2018-03-14 ENCOUNTER — Encounter (HOSPITAL_COMMUNITY)
Admission: RE | Admit: 2018-03-14 | Discharge: 2018-03-14 | Disposition: A | Discharge status: Home / Self Care | Payer: Medicaid Other | Source: Ambulatory Visit | Attending: Family Medicine | Admitting: Family Medicine

## 2018-03-14 LAB — CBC
HEMATOCRIT: 32.2 % — AB (ref 36.0–46.0)
Hemoglobin: 11 g/dL — ABNORMAL LOW (ref 12.0–15.0)
MCH: 29.9 pg (ref 26.0–34.0)
MCHC: 34.2 g/dL (ref 30.0–36.0)
MCV: 87.5 fL (ref 78.0–100.0)
Platelets: 228 10*3/uL (ref 150–400)
RBC: 3.68 MIL/uL — AB (ref 3.87–5.11)
RDW: 14 % (ref 11.5–15.5)
WBC: 7.1 10*3/uL (ref 4.0–10.5)

## 2018-03-14 LAB — CERVICOVAGINAL ANCILLARY ONLY
Chlamydia: NEGATIVE
NEISSERIA GONORRHEA: NEGATIVE

## 2018-03-14 LAB — TYPE AND SCREEN
ABO/RH(D): O POS
ANTIBODY SCREEN: NEGATIVE

## 2018-03-14 LAB — ABO/RH: ABO/RH(D): O POS

## 2018-03-14 NOTE — H&P (Signed)
  Robin Marks is an 37 y.o. [phone] 4184w4d female.   Chief Complaint: Previous C-section x 3 HPI: Term pregnancy with 3 prior C-sections, needs elective repeat and BTL. Previously failed Filshie clips.  Past Medical History:  Diagnosis Date  . 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 CESAREAN SECTION      Family History  Problem Relation Age of Onset  . Hypertension Mother   . Hyperthyroidism Mother    Social History:  reports that she has quit smoking. Her smoking use included cigars. She has never used smokeless tobacco. She reports that she drank alcohol. She reports that she does not use drugs.   No Known Allergies  No medications prior to admission.     A comprehensive review of systems was negative.  Last menstrual period 07/05/2017. General appearance: alert, cooperative and appears stated age Head: Normocephalic, without obvious abnormality, atraumatic Neck: supple, symmetrical, trachea midline Lungs: normal effort Heart: regular rate and rhythm Abdomen: gravid, Non-tender Extremities: extremities normal, atraumatic, no cyanosis or edema Skin: Skin color, texture, turgor normal. No rashes or lesions Neurologic: Grossly normal   Lab Results  Component Value Date   WBC 7.3 01/07/2018   HGB 10.8 (L) 01/07/2018   HCT 33.4 (L) 01/07/2018   MCV 90 01/07/2018   PLT 232 01/07/2018         ABO, Rh: O/Positive/-- (06/05 1533)  Antibody: Negative (06/05 1533)  Rubella: 1.09 (06/05 1533)  RPR: Non Reactive (07/08 1020)  HBsAg: Negative (06/05 1533)  HIV: Non Reactive (07/08 1020)  GBS:       Assessment/Plan Principal Problem:   History of cesarean section x 3 Active Problems:   Failed BTS- Fil[place]ie  For RCS with BTL. Risks include but are not limited to bleeding, infection, injury to surrounding structures, including bowel, bladder and ureters, blood clots, and death.  Likelihood of success is high. Patient  counseled, r.e. Risks benefits of BTL, including permanency of procedure, risk of failure(1:100), increased risk of ectopic.  Patient verbalized understanding and desires to proceed    Reva Boresanya S Jarick Harkins 03/14/2018, 9:12 AM

## 2018-03-14 NOTE — Patient Instructions (Signed)
Robin Marks  03/14/2018   Your procedure is scheduled on:  03/17/2018  Enter through the Main Entrance of [facility] at 0740 AM.  Pick up the phone at the desk and dial [phone]  Call this number if you have problems the morning of [phone]  Remember:   Do not eat food:(After Midnight) Desps de medianoche.  Do not drink clear liquids: (After Midnight) Desps de medianoche.  Take these medicines the morning of surgery with A SIP OF WATER: none   Do not wear jewelry, make-up or nail polish.  Do not wear lotions, powders, or perfumes. Do not wear deodorant.  Do not shave 48 hours prior to surgery.  Do not bring valuables to the hospital.  [facility] is not   responsible for any belongings or valuables brought to the hospital.  Contacts, dentures or bridgework may not be worn into surgery.  Leave suitcase in the car. After surgery it may be brought to your room.  For patients admitted to the hospital, checkout time is 11:00 AM the day of              discharge.    N/A   Please read over the following fact sheets that you were given:   Surgical Site Infection Prevention

## 2018-03-15 LAB — RPR: RPR: NONREACTIVE

## 2018-03-16 ENCOUNTER — Encounter (HOSPITAL_COMMUNITY): Payer: Self-pay | Admitting: Anesthesiology

## 2018-03-16 NOTE — Anesthesia Preprocedure Evaluation (Addendum)
Anesthesia Evaluation  Patient identified by MRN, date of birth, ID band Patient awake    Reviewed: Allergy & Precautions, NPO status , Patient's Chart, lab work & pertinent test results  Airway Mallampati: II  TM Distance: >3 FB Neck ROM: Full    Dental no notable dental hx. (+) Teeth Intact   Pulmonary former smoker,    Pulmonary exam normal breath sounds clear to auscultation       Cardiovascular negative cardio ROS Normal cardiovascular exam Rhythm:Regular Rate:Normal     Neuro/Psych negative neurological ROS  negative psych ROS   GI/Hepatic negative GI ROS, (+) Hepatitis -, B  Endo/Other  Obesity  Renal/GU negative Renal ROS  negative genitourinary   Musculoskeletal negative musculoskeletal ROS (+)   Abdominal (+) + obese,   Peds  Hematology  (+) anemia ,   Anesthesia Other Findings   Reproductive/Obstetrics (+) Pregnancy Previous C/Section Desires sterilization                            Anesthesia Physical Anesthesia Plan  ASA: II  Anesthesia Plan: Spinal   Post-op Pain Management:    Induction:   PONV Risk Score and Plan: 4 or greater and Scopolamine patch - Pre-op, Dexamethasone, Ondansetron and Treatment may vary due to age or medical condition  Airway Management Planned: Natural Airway  Additional Equipment:   Intra-op Plan:   Post-operative Plan:   Informed Consent: I have reviewed the patients History and Physical, chart, labs and discussed the procedure including the risks, benefits and alternatives for the proposed anesthesia with the patient or authorized representative who has indicated his/her understanding and acceptance.   Dental advisory given  Plan Discussed with: CRNA and Surgeon  Anesthesia Plan Comments:        Anesthesia Quick Evaluation

## 2018-03-17 ENCOUNTER — Inpatient Hospital Stay (HOSPITAL_COMMUNITY): Payer: Medicaid Other | Admitting: Anesthesiology

## 2018-03-17 ENCOUNTER — Encounter (HOSPITAL_COMMUNITY): Payer: Self-pay | Admitting: *Deleted

## 2018-03-17 ENCOUNTER — Encounter (HOSPITAL_COMMUNITY)
Admission: RE | Disposition: A | Discharge status: Home / Self Care | Payer: Self-pay | Source: Home / Self Care | Attending: Family Medicine

## 2018-03-17 ENCOUNTER — Inpatient Hospital Stay (HOSPITAL_COMMUNITY)
Admission: RE | Admit: 2018-03-17 | Discharge: 2018-03-20 | DRG: 785 | Disposition: A | Discharge status: Home / Self Care | Payer: Medicaid Other | Attending: Family Medicine | Admitting: Family Medicine

## 2018-03-17 DIAGNOSIS — Z23 Encounter for immunization: Secondary | ICD-10-CM | POA: Diagnosis not present

## 2018-03-17 DIAGNOSIS — O99214 Obesity complicating childbirth: Secondary | ICD-10-CM | POA: Diagnosis present

## 2018-03-17 DIAGNOSIS — D649 Anemia, unspecified: Secondary | ICD-10-CM | POA: Diagnosis present

## 2018-03-17 DIAGNOSIS — O9842 Viral hepatitis complicating childbirth: Secondary | ICD-10-CM

## 2018-03-17 DIAGNOSIS — E669 Obesity, unspecified: Secondary | ICD-10-CM | POA: Diagnosis present

## 2018-03-17 DIAGNOSIS — Z302 Encounter for sterilization: Secondary | ICD-10-CM

## 2018-03-17 DIAGNOSIS — Z87891 Personal history of nicotine dependence: Secondary | ICD-10-CM

## 2018-03-17 DIAGNOSIS — Z3A38 38 weeks gestation of pregnancy: Secondary | ICD-10-CM

## 2018-03-17 DIAGNOSIS — O9902 Anemia complicating childbirth: Secondary | ICD-10-CM | POA: Diagnosis present

## 2018-03-17 DIAGNOSIS — N9989 Other postprocedural complications and disorders of genitourinary system: Secondary | ICD-10-CM

## 2018-03-17 DIAGNOSIS — B169 Acute hepatitis B without delta-agent and without hepatic coma: Secondary | ICD-10-CM | POA: Diagnosis not present

## 2018-03-17 DIAGNOSIS — O34211 Maternal care for low transverse scar from previous cesarean delivery: Secondary | ICD-10-CM | POA: Diagnosis present

## 2018-03-17 DIAGNOSIS — Z98891 History of uterine scar from previous surgery: Secondary | ICD-10-CM

## 2018-03-17 SURGERY — Surgical Case
Anesthesia: Spinal

## 2018-03-17 MED ORDER — CEFAZOLIN SODIUM-DEXTROSE 2-4 GM/100ML-% IV SOLN
2.0000 g | INTRAVENOUS | Status: AC
  Administered 2018-03-17: 2 g via INTRAVENOUS
  Filled 2018-03-17: qty 100

## 2018-03-17 MED ORDER — ZOLPIDEM TARTRATE 5 MG PO TABS: 5.0000 mg | ORAL_TABLET | Freq: Every evening | ORAL | Status: DC | PRN

## 2018-03-17 MED ORDER — BUPIVACAINE HCL (PF) 0.25 % IJ SOLN
INTRAMUSCULAR | Status: DC | PRN
  Administered 2018-03-17: 30 mL

## 2018-03-17 MED ORDER — DIPHENHYDRAMINE HCL 25 MG PO CAPS: 25.0000 mg | ORAL_CAPSULE | ORAL | Status: DC | PRN

## 2018-03-17 MED ORDER — SENNOSIDES-DOCUSATE SODIUM 8.6-50 MG PO TABS
2.0000 | ORAL_TABLET | ORAL | Status: DC
  Administered 2018-03-18 – 2018-03-19 (×3): 2 via ORAL
  Filled 2018-03-17 (×3): qty 2

## 2018-03-17 MED ORDER — MORPHINE SULFATE (PF) 0.5 MG/ML IJ SOLN
INTRAMUSCULAR | Status: DC | PRN
  Administered 2018-03-17: .15 mg via INTRATHECAL

## 2018-03-17 MED ORDER — AMLODIPINE BESYLATE 5 MG PO TABS
5.0000 mg | ORAL_TABLET | Freq: Every day | ORAL | Status: DC
  Administered 2018-03-17: 5 mg via ORAL
  Filled 2018-03-17 (×2): qty 1

## 2018-03-17 MED ORDER — KETOROLAC TROMETHAMINE 30 MG/ML IJ SOLN
INTRAMUSCULAR | Status: AC
  Filled 2018-03-17: qty 1

## 2018-03-17 MED ORDER — DIPHENHYDRAMINE HCL 25 MG PO CAPS: 25.0000 mg | ORAL_CAPSULE | Freq: Four times a day (QID) | ORAL | Status: DC | PRN

## 2018-03-17 MED ORDER — DIBUCAINE 1 % RE OINT: 1.0000 "application " | TOPICAL_OINTMENT | RECTAL | Status: DC | PRN

## 2018-03-17 MED ORDER — FENTANYL CITRATE (PF) 100 MCG/2ML IJ SOLN
INTRAMUSCULAR | Status: AC
  Filled 2018-03-17: qty 2

## 2018-03-17 MED ORDER — SODIUM CHLORIDE 0.9% FLUSH: 3.0000 mL | INTRAVENOUS | Status: DC | PRN

## 2018-03-17 MED ORDER — NALBUPHINE HCL 10 MG/ML IJ SOLN: 5.0000 mg | INTRAMUSCULAR | Status: DC | PRN

## 2018-03-17 MED ORDER — OXYCODONE-ACETAMINOPHEN 5-325 MG PO TABS
1.0000 | ORAL_TABLET | ORAL | Status: DC | PRN
  Administered 2018-03-19: 1 via ORAL
  Filled 2018-03-17: qty 1

## 2018-03-17 MED ORDER — DIPHENHYDRAMINE HCL 50 MG/ML IJ SOLN: 12.5000 mg | INTRAMUSCULAR | Status: DC | PRN

## 2018-03-17 MED ORDER — LACTATED RINGERS IV SOLN: INTRAVENOUS | Status: AC

## 2018-03-17 MED ORDER — SIMETHICONE 80 MG PO CHEW: 80.0000 mg | CHEWABLE_TABLET | ORAL | Status: DC | PRN

## 2018-03-17 MED ORDER — KETOROLAC TROMETHAMINE 30 MG/ML IJ SOLN: 30.0000 mg | Freq: Four times a day (QID) | INTRAMUSCULAR | Status: AC | PRN

## 2018-03-17 MED ORDER — PRENATAL MULTIVITAMIN CH
1.0000 | ORAL_TABLET | Freq: Every day | ORAL | Status: DC
  Administered 2018-03-18 – 2018-03-20 (×3): 1 via ORAL
  Filled 2018-03-17 (×3): qty 1

## 2018-03-17 MED ORDER — ACETAMINOPHEN 325 MG PO TABS
650.0000 mg | ORAL_TABLET | ORAL | Status: DC | PRN
  Administered 2018-03-17 – 2018-03-18 (×2): 650 mg via ORAL
  Filled 2018-03-17 (×2): qty 2

## 2018-03-17 MED ORDER — KETOROLAC TROMETHAMINE 30 MG/ML IJ SOLN
30.0000 mg | Freq: Four times a day (QID) | INTRAMUSCULAR | Status: AC | PRN
  Administered 2018-03-17: 30 mg via INTRAMUSCULAR

## 2018-03-17 MED ORDER — SCOPOLAMINE 1 MG/3DAYS TD PT72
1.0000 | MEDICATED_PATCH | Freq: Once | TRANSDERMAL | Status: DC
  Administered 2018-03-17: 1.5 mg via TRANSDERMAL

## 2018-03-17 MED ORDER — COCONUT OIL OIL: 1.0000 "application " | TOPICAL_OIL | Status: DC | PRN

## 2018-03-17 MED ORDER — PHENYLEPHRINE 8 MG IN D5W 100 ML (0.08MG/ML) PREMIX OPTIME
INJECTION | INTRAVENOUS | Status: DC | PRN
  Administered 2018-03-17: 60 ug/min via INTRAVENOUS

## 2018-03-17 MED ORDER — OXYTOCIN 10 UNIT/ML IJ SOLN
INTRAVENOUS | Status: DC | PRN
  Administered 2018-03-17: 40 [IU] via INTRAVENOUS

## 2018-03-17 MED ORDER — MENTHOL 3 MG MT LOZG: 1.0000 | LOZENGE | OROMUCOSAL | Status: DC | PRN

## 2018-03-17 MED ORDER — LACTATED RINGERS IV SOLN
INTRAVENOUS | Status: DC
  Administered 2018-03-17 (×3): via INTRAVENOUS

## 2018-03-17 MED ORDER — FENTANYL CITRATE (PF) 100 MCG/2ML IJ SOLN
INTRAMUSCULAR | Status: DC | PRN
  Administered 2018-03-17: 15 ug via INTRATHECAL

## 2018-03-17 MED ORDER — BUPIVACAINE HCL (PF) 0.25 % IJ SOLN
INTRAMUSCULAR | Status: AC
  Filled 2018-03-17: qty 30

## 2018-03-17 MED ORDER — NALOXONE HCL 4 MG/10ML IJ SOLN
1.0000 ug/kg/h | INTRAVENOUS | Status: DC | PRN
  Filled 2018-03-17: qty 5

## 2018-03-17 MED ORDER — ONDANSETRON HCL 4 MG/2ML IJ SOLN
INTRAMUSCULAR | Status: AC
  Filled 2018-03-17: qty 2

## 2018-03-17 MED ORDER — BUPIVACAINE IN DEXTROSE 0.75-8.25 % IT SOLN
INTRATHECAL | Status: DC | PRN
  Administered 2018-03-17: 12 mg via INTRATHECAL

## 2018-03-17 MED ORDER — SIMETHICONE 80 MG PO CHEW
80.0000 mg | CHEWABLE_TABLET | ORAL | Status: DC
  Administered 2018-03-18 – 2018-03-19 (×3): 80 mg via ORAL
  Filled 2018-03-17 (×3): qty 1

## 2018-03-17 MED ORDER — IBUPROFEN 600 MG PO TABS
600.0000 mg | ORAL_TABLET | Freq: Four times a day (QID) | ORAL | Status: DC
  Administered 2018-03-17 – 2018-03-20 (×12): 600 mg via ORAL
  Filled 2018-03-17 (×12): qty 1

## 2018-03-17 MED ORDER — SIMETHICONE 80 MG PO CHEW
80.0000 mg | CHEWABLE_TABLET | Freq: Three times a day (TID) | ORAL | Status: DC
  Administered 2018-03-17 – 2018-03-20 (×7): 80 mg via ORAL
  Filled 2018-03-17 (×7): qty 1

## 2018-03-17 MED ORDER — ONDANSETRON HCL 4 MG/2ML IJ SOLN
INTRAMUSCULAR | Status: DC | PRN
  Administered 2018-03-17: 4 mg via INTRAVENOUS

## 2018-03-17 MED ORDER — ONDANSETRON HCL 4 MG/2ML IJ SOLN: 4.0000 mg | Freq: Three times a day (TID) | INTRAMUSCULAR | Status: DC | PRN

## 2018-03-17 MED ORDER — NALBUPHINE HCL 10 MG/ML IJ SOLN: 5.0000 mg | Freq: Once | INTRAMUSCULAR | Status: DC | PRN

## 2018-03-17 MED ORDER — OXYTOCIN 40 UNITS IN LACTATED RINGERS INFUSION - SIMPLE MED: 2.5000 [IU]/h | INTRAVENOUS | Status: AC

## 2018-03-17 MED ORDER — WITCH HAZEL-GLYCERIN EX PADS: 1.0000 "application " | MEDICATED_PAD | CUTANEOUS | Status: DC | PRN

## 2018-03-17 MED ORDER — SCOPOLAMINE 1 MG/3DAYS TD PT72
MEDICATED_PATCH | TRANSDERMAL | Status: AC
  Filled 2018-03-17: qty 1

## 2018-03-17 MED ORDER — OXYCODONE-ACETAMINOPHEN 5-325 MG PO TABS: 2.0000 | ORAL_TABLET | ORAL | Status: DC | PRN

## 2018-03-17 MED ORDER — LACTATED RINGERS IV SOLN
INTRAVENOUS | Status: DC
  Administered 2018-03-18: via INTRAVENOUS

## 2018-03-17 MED ORDER — NALOXONE HCL 0.4 MG/ML IJ SOLN: 0.4000 mg | INTRAMUSCULAR | Status: DC | PRN

## 2018-03-17 MED ORDER — INFLUENZA VAC SPLIT QUAD 0.5 ML IM SUSY
0.5000 mL | PREFILLED_SYRINGE | INTRAMUSCULAR | Status: AC
  Administered 2018-03-18: 0.5 mL via INTRAMUSCULAR

## 2018-03-17 MED ORDER — MORPHINE SULFATE (PF) 0.5 MG/ML IJ SOLN
INTRAMUSCULAR | Status: AC
  Filled 2018-03-17: qty 10

## 2018-03-17 MED ORDER — SOD CITRATE-CITRIC ACID 500-334 MG/5ML PO SOLN: 30.0000 mL | Freq: Once | ORAL | Status: DC

## 2018-03-17 MED ORDER — MEPERIDINE HCL 25 MG/ML IJ SOLN: 6.2500 mg | INTRAMUSCULAR | Status: DC | PRN

## 2018-03-17 MED ORDER — OXYTOCIN 10 UNIT/ML IJ SOLN
INTRAMUSCULAR | Status: AC
  Filled 2018-03-17: qty 4

## 2018-03-17 MED ORDER — PHENYLEPHRINE 8 MG IN D5W 100 ML (0.08MG/ML) PREMIX OPTIME
INJECTION | INTRAVENOUS | Status: AC
  Filled 2018-03-17: qty 100

## 2018-03-17 SURGICAL SUPPLY — 34 items
BENZOIN TINCTURE PRP APPL 2/3 (GAUZE/BANDAGES/DRESSINGS) ×3 IMPLANT
CHLORAPREP W/TINT 26ML (MISCELLANEOUS) ×3 IMPLANT
CLAMP CORD UMBIL (MISCELLANEOUS) IMPLANT
CLOSURE WOUND 1/2 X4 (GAUZE/BANDAGES/DRESSINGS) ×1
CLOTH BEACON ORANGE TIMEOUT ST (SAFETY) ×3 IMPLANT
DECANTER SPIKE VIAL GLASS SM (MISCELLANEOUS) ×3 IMPLANT
DRSG OPSITE POSTOP 4X10 (GAUZE/BANDAGES/DRESSINGS) ×3 IMPLANT
ELECT REM PT RETURN 9FT ADLT (ELECTROSURGICAL) ×3
ELECTRODE REM PT RTRN 9FT ADLT (ELECTROSURGICAL) ×1 IMPLANT
EXTRACTOR VACUUM M CUP 4 TUBE (SUCTIONS) IMPLANT
EXTRACTOR VACUUM M CUP 4' TUBE (SUCTIONS)
GLOVE BIOGEL PI IND STRL 7.0 (GLOVE) ×3 IMPLANT
GLOVE BIOGEL PI INDICATOR 7.0 (GLOVE) ×6
GLOVE ECLIPSE 7.0 STRL STRAW (GLOVE) ×3 IMPLANT
GOWN STRL REUS W/TWL LRG LVL3 (GOWN DISPOSABLE) ×9 IMPLANT
HEMOSTAT ARISTA ABSORB 3G PWDR (MISCELLANEOUS) ×3 IMPLANT
KIT ABG SYR 3ML LUER SLIP (SYRINGE) IMPLANT
NEEDLE HYPO 22GX1.5 SAFETY (NEEDLE) ×3 IMPLANT
NEEDLE HYPO 25X5/8 SAFETYGLIDE (NEEDLE) IMPLANT
NS IRRIG 1000ML POUR BTL (IV SOLUTION) ×3 IMPLANT
PACK C SECTION WH (CUSTOM PROCEDURE TRAY) ×3 IMPLANT
PAD ABD 7.5X8 STRL (GAUZE/BANDAGES/DRESSINGS) ×3 IMPLANT
PAD OB MATERNITY 4.3X12.25 (PERSONAL CARE ITEMS) ×3 IMPLANT
PENCIL SMOKE EVAC W/HOLSTER (ELECTROSURGICAL) ×3 IMPLANT
RTRCTR C-SECT PINK 25CM LRG (MISCELLANEOUS) IMPLANT
SPONGE GAUZE 4X4 12PLY STER LF (GAUZE/BANDAGES/DRESSINGS) ×3 IMPLANT
SPONGE LAP 18X18 RF (DISPOSABLE) ×12 IMPLANT
STRIP CLOSURE SKIN 1/2X4 (GAUZE/BANDAGES/DRESSINGS) ×2 IMPLANT
SUT VIC AB 0 CTX 36 (SUTURE) ×6
SUT VIC AB 0 CTX36XBRD ANBCTRL (SUTURE) ×3 IMPLANT
SUT VIC AB 4-0 KS 27 (SUTURE) ×3 IMPLANT
SYR 30ML LL (SYRINGE) ×3 IMPLANT
TOWEL OR 17X24 6PK STRL BLUE (TOWEL DISPOSABLE) ×3 IMPLANT
TRAY FOLEY W/BAG SLVR 14FR LF (SET/KITS/TRAYS/PACK) ×3 IMPLANT

## 2018-03-17 NOTE — Anesthesia Postprocedure Evaluation (Signed)
Anesthesia Post Note  Patient: Robin Marks  Procedure(s) Performed: repeat CESAREAN SECTION WITH BILATERAL salpingectomy (N/A )     Patient location during evaluation: PACU Anesthesia Type: Spinal Level of consciousness: oriented and awake and alert Pain management: pain level controlled Vital Signs Assessment: post-procedure vital signs reviewed and stable Respiratory status: spontaneous breathing, respiratory function stable and nonlabored ventilation Cardiovascular status: blood pressure returned to baseline and stable Postop Assessment: no headache, no backache, no apparent nausea or vomiting, patient able to bend at knees and spinal receding Anesthetic complications: no    Last Vitals:  Vitals:   03/17/18 1145 03/17/18 1200  BP: 117/90 109/79  Pulse: 67 (!) 59  Resp: 19 11  Temp:    SpO2: 100% 100%    Last Pain:  Vitals:   03/17/18 1110  TempSrc: Oral   Pain Goal:                 Sissy Goetzke A.

## 2018-03-17 NOTE — Interval H&P Note (Signed)
History and Physical Interval Note:  03/17/2018 9:01 AM  Robin Marks  has presented today for surgery, with the diagnosis of RCS Undesired Fertility  The various methods of treatment have been discussed with the patient and family. After consideration of risks, benefits and other options for treatment, the patient has consented to  Procedure(s): repeat CESAREAN SECTION WITH BILATERAL TUBAL LIGATION (N/A) as a surgical intervention. The patient's history has been reviewed, patient examined, no change in status, stable for surgery.  I have reviewed the patient's chart and labs.  Questions were answered to the patient's satisfaction.     Reva Boresanya S Miosha Behe

## 2018-03-17 NOTE — Op Note (Addendum)
Preoperative Diagnosis:  IUP @ 1642w0d, repeat lower transverse Cesarean section  Postoperative Diagnosis:  Same  Procedure: Repeat low transverse cesarean section, Bilateral Tubal Ligation with partial salpingectomy   Surgeon: Tinnie Gensanya Pratt, M.D. Assistant: Rhett BannisterLaurel Wallace, DO  Anesthesia: spinal  Findings: Viable female infant, APGARs and weight pending - vigorous at delivery  Estimated blood loss: 559cc Urine Output: 250cc, clear  Fluids: 2000cc LR  Complications: None known  Specimens: Placenta to labor and delivery  Reason for procedure: Briefly, the patient is a 37 y.o. [id number] at 4842w0d with h/o previous C-section who desires permanent sterility.  Patient counseled, re risksbenefits of BTL, including permanency of procedure, risk of failure(1:100), increased risk of ectopic.  Patient verbalized understanding and desires to proceed  Procedure: Patient to the OR where spinal analgesia was administered. She was then placed in a supine position with left lateral tilt. She received 2 g of Ancef and SCDs were in place. A Foley catheter was placed in the bladder. She was prepped and draped in the usual sterile fashion. A timeout was performed. A knife was then used to make a Pfannenstiel incision. This incision was carried out to underlying fascia which was divided in the midline with the knife. The incision was extended laterally, sharply. The fascia was dissected off the underlying rectus superiorly and inferiorly.  The rectus was divided in the midline. There was difficulty entering the peritoneal cavity, as the uterus was adhered to the anterior abdominal wall at the rectus. A modified Maylard incision was used to transverse the rectus. Using sharp and blunt dissection, the peritoneal cavity was then entered bluntly. Attention was then taken to the bladder. The bladder reflection was cut transversely to allow the bladder to drop inferiorly and a bladder blade was then placed. A knife was used to  make a low transverse incision on the uterus.  This incision was carried down to the amniotic cavity was entered. Fetus was in LOA position and was brought up out of the incision without difficulty. Delayed cord clamping after a minute. Cord was clamped x 2 and cut. Infant taken to waiting pediatrician.  Cord blood was obtained. Placenta was delivered from the uterus.  Uterus was cleaned with dry lap pads. Uterine incision closed with 0 Vicryl suture in a locked running fashion. Attention was turned to the pt's right tube which was grasped with a Babcock clamp and followed to its fimbriated end.   A Kelly clamp was placed over the tube incorporating the fimbriated end. 2 free ties placed below the clamp for hemostasis. Hemostasis noted after removal of the Kelly clamp and tube was allowed to fall back into abdominal cavity.   Attention was turned to the pt's left tube which was grasped with a Babcock clamp and followed to its fimbriated end.  No Filshie clip observed. A similar procedure was used to remove a portion of the tube on this side nincluding the fimbriated end. Arista placed in cavity over the uterine incision as wwell as areas where the uterus was adherent to the rectus and was oozy. Rectus brought back together including peritoneum. Closure was done with 0 Vicryl suture.   Fascia is closed with 0 Vicryl suture in a running fashion. Subcutaneous tissue infused with 30cc 0.25% Marcaine.  Skin closed using 3-0 Vicryl on a Keith needle.  Steri strips applied, followed by pressure dressing.  All instrument, needle and lap counts were correct x 2.  Patient was awake and taken to PACU stable.  Infant to  Newborn Nursery, stable.   Cristal Deer. Earlene Plater, DO OB/GYN Fellow

## 2018-03-17 NOTE — Lactation Note (Addendum)
This note was copied from a baby's chart. Lactation Consultation Note  Patient Name: Robin Marks Today's Date: 03/17/2018 Reason for consult: Initial assessment;Term P4, 10 hrs. Female infant Per mom infant had 2 wet diapers, at end of feeding elder sister changed a soiled diaper (meconium) while LC was in room. Per mom BF other 3 children and 3rd child one year. [facility] but is interested in applying.  Mom has short shaft nipples and mom will pre-pump before latching or hand express to help enlongate nipple out more.  Mom doesn't have pump at home and hand pump  with 27 mm flange given by LC. Mom demonstrated hand expression and LC noticed a suck blister on mom's right breast. comfort gel given. Mom BF prior LC enter room for 15 minutes using cradle hold on right breast infant swallows heard, Infant BF for 25 minutes and coming off breast LC notice blister on right breast. LC asked mom to  latch infant on left  breast to  demonstrate using a cross-cradle hold  position and stressed important infant mouth wide before latching to breast  with jaw down for deeper latch.  Mom stated she could tell a difference with the latch. Mom will call LC or nurse if she had any questions, concerns or need help latching infant to breast. LC discussed I&O. Mom encouraged to feed baby 8-12 times/24 hours and with feeding cues.  LC discussed: LC outpatient CL, BF support groups, LC hotline and BF resources within the local community.   Maternal Data Formula Feeding for Exclusion: No Has patient been taught Hand Expression?: (MOm demostrated hand expression LC colostrum expressed) Does the patient have breastfeeding experience prior to this delivery?: Yes  Feeding Feeding Type: Breast Fed Length of feed: 25 min  LATCH Score Latch: Grasps breast easily, tongue down, lips flanged, rhythmical sucking.  Audible Swallowing: Spontaneous and intermittent  Type of Nipple: Everted at rest and  after stimulation(slightly short shafted )  Comfort (Breast/Nipple): Filling, red/small blisters or bruises, mild/mod discomfort  Hold (Positioning): Assistance needed to correctly position infant at breast and maintain latch.  LATCH Score: 8  Interventions Interventions: Breast feeding basics reviewed;Support pillows;Assisted with latch;Position options;Breast massage;Hand express;Breast compression;Hand pump;Adjust position;Comfort gels  Lactation Tools Discussed/Used WIC Program: No([facility] [place] Pump Review: Setup, frequency, and cleaning Initiated by:: Danelle Earthlyobin Noella Kipnis, IBCLC Date initiated:: 03/17/18   Consult Status Consult Status: Follow-up Date: 03/18/18 Follow-up type: In-patient    Danelle EarthlyRobin Joshva Labreck 03/17/2018, 8:30 PM

## 2018-03-17 NOTE — Progress Notes (Signed)
Called to room because patient had persistent oozing from dressing. Dressing removed and steri-strips saturated as no longer completely sticking to skin. Sterile gloves used to replace steri-strips, honeycomb dressing and pressure dressing.   Additionally, orthostatic Bps with elevated diastolic BP. Patient was given amlodipine 5mg  once for moderate range BP. Will continue to monitor. Patient without history of any hypertensive disorders of pregnancy. Will plan to continue to monitor BP and check CMP along with CBC in the morning.

## 2018-03-17 NOTE — Progress Notes (Signed)
Obtained orthostatic blood pressure to be able to get pt up to bathroom.  Bps elevated see flow sheet.  Walked pt to bathroom and changed pad.  Once pt stood up more blood ran down her leg.  Got pt back into the bed and saw that dressing  was saturated and coming out from pressure dressing.  Called Wallace DO.  Informed her of blood pressures and bleeding.  She came to room and changed dressing and ordered am labs.  Will continue to monitor Bps and dressing.

## 2018-03-17 NOTE — Anesthesia Procedure Notes (Signed)
Spinal  Patient location during procedure: OR Start time: 03/17/2018 9:35 AM End time: 03/17/2018 9:39 AM Staffing Anesthesiologist: Mal AmabileFoster, Bocephus Cali, MD Performed: anesthesiologist  Preanesthetic Checklist Completed: patient identified, site marked, surgical consent, pre-op evaluation, timeout performed, IV checked, risks and benefits discussed and monitors and equipment checked Spinal Block Patient position: sitting Prep: site prepped and draped and DuraPrep Patient monitoring: heart rate, cardiac monitor, continuous pulse ox and blood pressure Approach: midline Location: L3-4 Injection technique: single-shot Needle Needle type: Pencan  Needle gauge: 24 G Needle length: 9 cm Needle insertion depth: 6 cm Assessment Sensory level: T4 Additional Notes Patient tolerated procedure well. Adequate sensory level.

## 2018-03-17 NOTE — Transfer of Care (Signed)
Immediate Anesthesia Transfer of Care Note  Patient: Robin Marks  Procedure(s) Performed: repeat CESAREAN SECTION WITH BILATERAL salpingectomy (N/A )  Patient Location: PACU  Anesthesia Type:Spinal  Level of Consciousness: awake  Airway & Oxygen Therapy: Patient Spontanous Breathing  Post-op Assessment: Report given to RN  Post vital signs: Reviewed and stable  Last Vitals:  Vitals Value Taken Time  BP    Temp    Pulse    Resp    SpO2      Last Pain: There were no vitals filed for this visit.       Complications: No apparent anesthesia complications

## 2018-03-18 ENCOUNTER — Encounter (HOSPITAL_COMMUNITY): Payer: Self-pay

## 2018-03-18 ENCOUNTER — Other Ambulatory Visit: Payer: Self-pay

## 2018-03-18 LAB — COMPREHENSIVE METABOLIC PANEL
ALBUMIN: 2.4 g/dL — AB (ref 3.5–5.0)
ALK PHOS: 62 U/L (ref 38–126)
ALT: 14 U/L (ref 0–44)
AST: 25 U/L (ref 15–41)
Anion gap: 7 (ref 5–15)
BILIRUBIN TOTAL: 0.3 mg/dL (ref 0.3–1.2)
BUN: 6 mg/dL (ref 6–20)
CALCIUM: 7.7 mg/dL — AB (ref 8.9–10.3)
CO2: 22 mmol/L (ref 22–32)
CREATININE: 0.76 mg/dL (ref 0.44–1.00)
Chloride: 103 mmol/L (ref 98–111)
GFR calc Af Amer: >60 mL/min (ref >60)
GLUCOSE: 92 mg/dL (ref 70–99)
POTASSIUM: 3.3 mmol/L — AB (ref 3.5–5.1)
Sodium: 132 mmol/L — ABNORMAL LOW (ref 135–145)
TOTAL PROTEIN: 5.2 g/dL — AB (ref 6.5–8.1)

## 2018-03-18 LAB — CBC
HEMATOCRIT: 25.7 % — AB (ref 36.0–46.0)
HEMOGLOBIN: 8.8 g/dL — AB (ref 12.0–15.0)
MCH: 29.8 pg (ref 26.0–34.0)
MCHC: 34.2 g/dL (ref 30.0–36.0)
MCV: 87.1 fL (ref 78.0–100.0)
Platelets: 173 10*3/uL (ref 150–400)
RBC: 2.95 MIL/uL — AB (ref 3.87–5.11)
RDW: 14.3 % (ref 11.5–15.5)
WBC: 7.1 10*3/uL (ref 4.0–10.5)

## 2018-03-18 NOTE — Anesthesia Postprocedure Evaluation (Signed)
Anesthesia Post Note  Patient: Robin Marks  Procedure(s) Performed: repeat CESAREAN SECTION WITH BILATERAL salpingectomy (N/A )     Patient location during evaluation: Mother Baby Anesthesia Type: Spinal Level of consciousness: awake and alert Pain management: pain level controlled Vital Signs Assessment: post-procedure vital signs reviewed and stable Respiratory status: spontaneous breathing, nonlabored ventilation and respiratory function stable Cardiovascular status: stable Postop Assessment: no headache, no backache, spinal receding, patient able to bend at knees, able to ambulate, adequate PO intake and no apparent nausea or vomiting Anesthetic complications: no    Last Vitals:  Vitals:   03/18/18 0350 03/18/18 0736  BP: 115/81 122/75  Pulse: 64 (!) 57  Resp: 14 18  Temp: 36.9 C 36.6 C  SpO2: 98% 100%    Last Pain:  Vitals:   03/18/18 0736  TempSrc: Oral  PainSc: 0-No pain   Pain Goal:                 Robin Marks,Robin Marks

## 2018-03-18 NOTE — Addendum Note (Signed)
Addendum  created 03/18/18 0815 by Kainen Struckman H, CRNA   Sign clinical note    

## 2018-03-18 NOTE — Progress Notes (Signed)
Post Partum Day 1 Subjective: Robin Marks is a 37 year old 124P3104 female who is postpartum day 1. She states that she is doing "good". She reports that she has been up out of bed since delivery. She denies stooling but reports she is having flatus. She states that she has been able to eat without nausea since delivery. Patient reports that vaginal bleeding is better and she changed her last pad at around 0615. She denies headache, vision changes, and RUQ pain. She is currently breast feeding and had a tubal ligation for contraceptive purposes. She reports that breast feeding is "going really well".  Objective: Blood pressure 115/81, pulse 64, temperature 98.4 F (36.9 C), temperature source Oral, resp. rate 14, height 5\' 1"  (1.549 m), weight 86.6 kg, last menstrual period 07/05/2017, SpO2 98 %, unknown if currently breastfeeding.  Physical Exam:  General: Robin Marks was a well appearing female that did not appear to be in any acute distress. She was sitting up in bed comfortably. She was able to answer questions appropriately.  Heart: RRR, no murmurs or rubs noted Lungs: CTA, no wheezes or crackles noted Uterine Fundus: Palpable and firm. No pain was elicited on palpation of the abdomen.  DVT Evaluation: Lower extremities without edema bilaterally. No pain was elicited on calf palpation or bilateral foot dorsiflexion.   Recent Labs    03/18/18 0510  HGB 8.8*  HCT 25.7*   Results for orders placed or performed during the hospital encounter of 03/17/18 (from the past 24 hour(s))  CBC     Status: Abnormal   Collection Time: 03/18/18  5:10 AM  Result Value Ref Range   WBC 7.1 4.0 - 10.5 K/uL   RBC 2.95 (L) 3.87 - 5.11 MIL/uL   Hemoglobin 8.8 (L) 12.0 - 15.0 g/dL   HCT 13.025.7 (L) 86.536.0 - 78.446.0 %   MCV 87.1 78.0 - 100.0 fL   MCH 29.8 26.0 - 34.0 pg   MCHC 34.2 30.0 - 36.0 g/dL   RDW 69.614.3 29.511.5 - 28.415.5 %   Platelets 173 150 - 400 K/uL  Comprehensive metabolic panel     Status: Abnormal   Collection Time: 03/18/18  5:10 AM  Result Value Ref Range   Sodium 132 (L) 135 - 145 mmol/L   Potassium 3.3 (L) 3.5 - 5.1 mmol/L   Chloride 103 98 - 111 mmol/L   CO2 22 22 - 32 mmol/L   Glucose, Bld 92 70 - 99 mg/dL   BUN 6 6 - 20 mg/dL   Creatinine, Ser 1.320.76 0.44 - 1.00 mg/dL   Calcium 7.7 (L) 8.9 - 10.3 mg/dL   Total Protein 5.2 (L) 6.5 - 8.1 g/dL   Albumin 2.4 (L) 3.5 - 5.0 g/dL   AST 25 15 - 41 U/L   ALT 14 0 - 44 U/L   Alkaline Phosphatase 62 38 - 126 U/L   Total Bilirubin 0.3 0.3 - 1.2 mg/dL   GFR calc non Af Amer >60 >60 mL/min   GFR calc Af Amer >60 >60 mL/min   Anion gap 7 5 - 15    Assessment/Plan: Assessment: Robin Marks is postpartum day 1. She appears to be in stable condition. Patient did have a history of moderately elevated blood pressures, the last three were 115/81,115/77, and 132/96. Bleeding has decreased and she is able to tolerate activity and PO intake. She is currently breastfeeding without any concerns. Her last hemoglobin was 8.8.  Plan: Continue to monitor amount of vaginal bleeding  and blood pressure. Follow-up in order to reassess if she is having any issues or concerns regarding breast feeding. Monitor patient to determine if she has any lightheadedness or dizziness due to her low hemoglobin. Monitor incision site in order to determine if the oozing continues or if are any signs of infection such as erythema or signs of systemic infection.      LOS: 1 day   Charyl Dancer 03/18/2018, 7:34 AM

## 2018-03-19 ENCOUNTER — Encounter (HOSPITAL_COMMUNITY): Payer: Self-pay | Admitting: Family Medicine

## 2018-03-19 NOTE — Progress Notes (Signed)
POD day 2 Subjective: Robin Marks is a 37 year old [id number] female who is post-operative day 2. She states that she is doing "better today". She reports that she has been up out of bed. She also reports that she is eating regularly without nausea. Patient reports that she has had 3 bowel movements since delivery. She reports that her bleeding is less than yesterday. Patient denies headache, vision changes, RUQ pain, lightheadedness, and dizziness. She reports mild pain with activity on the right side of her incision. Patient is currently breastfeeding and states it is going well. She under went a BTL for future contraception.   Objective: Blood pressure (!) 126/93, pulse 82, temperature 98.2 F (36.8 C), resp. rate 18, height 5\' 1"  (1.549 m), weight 86.6 kg, last menstrual period 07/05/2017, SpO2 98 %, unknown if currently breastfeeding.  Physical Exam:  General: Patient was alert and cooperative throughout the encounter.  Heart: RRR, without murmurs or gallops noted.  Lungs: CTA, without wheezes or rales noted.  Uterine Fundus: firm DVT Evaluation: Lower extremities without edema or pain to palpation. Negative Homan's sign.   Recent Labs    03/18/18 0510  HGB 8.8*  HCT 25.7*    Assessment/Plan: Assessment: Robin Marks is post operative day 2 and appears to be in stable condition. She is tolerating PO diet, activity, and is having BMs. Last three blood pressures were 123/93, 125/82, and 125/83.  Plan is to monitor patient for increased pain or vaginal bleeding. Monitor for increased drainage or sings of infection at incision site. Continue to monitor blood pressures for any abnormal values. Re-evaluate patient for discharge tomorrow.    LOS: 2 days   Charyl Dancerrin Mathan Darroch 03/19/2018, 7:42 AM

## 2018-03-20 MED ORDER — OXYCODONE-ACETAMINOPHEN 5-325 MG PO TABS: 1.0000 | ORAL_TABLET | ORAL | 0 refills | Status: AC | PRN

## 2018-03-20 MED ORDER — IBUPROFEN 600 MG PO TABS: 600.0000 mg | ORAL_TABLET | Freq: Four times a day (QID) | ORAL | 3 refills | Status: AC

## 2018-03-20 MED ORDER — SENNOSIDES-DOCUSATE SODIUM 8.6-50 MG PO TABS: 2.0000 | ORAL_TABLET | ORAL | 0 refills | Status: AC

## 2018-03-20 NOTE — Lactation Note (Signed)
This note was copied from a baby's chart. Lactation Consultation Note  Patient Name: Robin Burnard HawthorneMonisha Lutze Today's Date: 03/20/2018 Reason for consult: Follow-up assessment;Term  P4 mother whose infant is now 2970 hours old.    Mother has no questions/concerns about breast feeding.  She breasts are full and no nipple trauma.  Mother stated that her manual pump was not working.  Upon assessment it was not creating any suction and a new pump was given.  Flange size #24 is appropriate at this time and this pump works appropriately.  Engorgement prevention/ treatment reviewed.  Mother was familiar with this.  She will call for any questions she may have.  OP phone number provided.   Maternal Data Formula Feeding for Exclusion: No Has patient been taught Hand Expression?: Yes Does the patient have breastfeeding experience prior to this delivery?: Yes  Feeding    LATCH Score                   Interventions    Lactation Tools Discussed/Used     Consult Status Consult Status: Complete Date: 03/20/18 Follow-up type: Call as needed    Sidni Fusco R Laritza Vokes 03/20/2018, 8:14 AM

## 2018-03-20 NOTE — Discharge Summary (Signed)
OB Discharge Summary     Patient Name: Robin Marks DOB: Dec 15, 1980 MRN: [id number]  Date of admission: 03/17/2018 Delivering MD: Reva BoresPRATT, TANYA S   Date of discharge: 03/20/2018  Admitting diagnosis: RCS Undesired Fertility Intrauterine pregnancy: 8538w0d     Secondary diagnosis:  Principal Problem:   History of cesarean section x 3 Active Problems:   Failed BTS- [place]  Discharge diagnosis: Term Pregnancy Delivered                                                                                                Post partum procedures:Tubal ligation with CS   Augmentation: none  Complications: None  Hospital course:  Sceduled C/S   37 y.o. yo [id number] at 2938w0d was admitted to the hospital 03/17/2018 for scheduled cesarean section with the following indication:Elective Repeat.  Membrane Rupture Time/Date: 10:06 AM ,03/17/2018   Patient delivered a Viable infant.03/17/2018  Details of operation can be found in separate operative note.  Pateint had an uncomplicated postpartum course.  She is ambulating, tolerating a regular diet, passing flatus, and urinating well. Patient is discharged home in stable condition on  03/20/18         Physical exam  Vitals:   03/19/18 0638 03/19/18 1449 03/19/18 2230 03/20/18 0515  BP: (!) 126/93 122/81 (!) 133/96 128/89  Pulse: 82 85 89 75  Resp: 18 16 16 18   Temp: 98.2 F (36.8 C) 98.3 F (36.8 C) 98.8 F (37.1 C) 98.7 F (37.1 C)  TempSrc:  Oral Oral Oral  SpO2:  98% 99%   Weight:      Height:       General: alert and cooperative Lochia: appropriate Uterine Fundus: firm Incision: Dressing is clean, dry, and intact DVT Evaluation: No evidence of DVT seen on physical exam. Labs: Lab Results  Component Value Date   WBC 7.1 03/18/2018   HGB 8.8 (L) 03/18/2018   HCT 25.7 (L) 03/18/2018   MCV 87.1 03/18/2018   PLT 173 03/18/2018   CMP Latest Ref Rng & Units 03/18/2018  Glucose 70 - 99 mg/dL 92  BUN 6 - 20 mg/dL 6  Creatinine 2.130.44 -  0.861.00 mg/dL 5.780.76  Sodium 469135 - 629145 mmol/L 132(L)  Potassium 3.5 - 5.1 mmol/L 3.3(L)  Chloride 98 - 111 mmol/L 103  CO2 22 - 32 mmol/L 22  Calcium 8.9 - 10.3 mg/dL 7.7(L)  Total Protein 6.5 - 8.1 g/dL 5.2(L)  Total Bilirubin 0.3 - 1.2 mg/dL 0.3  Alkaline Phos 38 - 126 U/L 62  AST 15 - 41 U/L 25  ALT 0 - 44 U/L 14    Discharge instruction: per After Visit Summary and "Baby and Me Booklet".  After visit meds:  Allergies as of 03/20/2018   No Known Allergies     Medication List    TAKE these medications   acetaminophen 325 MG tablet Commonly known as:  TYLENOL Take 325 mg by mouth every 6 (six) hours as needed.   ibuprofen 600 MG tablet Commonly known as:  ADVIL,MOTRIN Take 1 tablet (600 mg total) by mouth every 6 (six) hours.  oxyCODONE-acetaminophen 5-325 MG tablet Commonly known as:  PERCOCET/ROXICET Take 1 tablet by mouth every 4 (four) hours as needed (pain scale 4-7).   PRENATAL AD PO Take 1 tablet by mouth daily.   senna-docusate 8.6-50 MG tablet Commonly known as:  Senokot-S Take 2 tablets by mouth daily. Start taking on:  03/21/2018       Diet: routine diet  Activity: Advance as tolerated. Pelvic rest for 6 weeks.   Outpatient follow up:2 weeks for incision check; 4 weeks for PP visit  Follow up Appt:No future appointments. Follow up Visit:No follow-ups on file.  Postpartum contraception: Tubal Ligation  Newborn Data: Live born female  Birth Weight: 6 lb 12.1 oz (3065 g) APGAR: 8, 9  Newborn Delivery   Birth date/time:  03/17/2018 10:07:00 Delivery type:  C-Section, Low Transverse Trial of labor:  No C-section categorization:  Repeat     Baby Feeding: Breast Disposition:home with mother   03/20/2018 De Hollingshead, DO

## 2018-03-20 NOTE — Discharge Instructions (Signed)

## 2018-04-18 ENCOUNTER — Ambulatory Visit (INDEPENDENT_AMBULATORY_CARE_PROVIDER_SITE_OTHER): Payer: Medicaid Other | Admitting: Obstetrics

## 2018-04-18 ENCOUNTER — Other Ambulatory Visit: Payer: Self-pay

## 2018-04-18 ENCOUNTER — Encounter: Payer: Self-pay | Admitting: Obstetrics

## 2018-04-18 DIAGNOSIS — Z9851 Tubal ligation status: Secondary | ICD-10-CM

## 2018-04-18 DIAGNOSIS — Z1389 Encounter for screening for other disorder: Secondary | ICD-10-CM | POA: Diagnosis not present

## 2018-04-18 DIAGNOSIS — O165 Unspecified maternal hypertension, complicating the puerperium: Secondary | ICD-10-CM

## 2018-04-18 MED ORDER — TRIAMTERENE-HCTZ 37.5-25 MG PO CAPS: 1.0000 | ORAL_CAPSULE | Freq: Every day | ORAL | 5 refills | Status: AC

## 2018-04-18 MED ORDER — AMLODIPINE BESYLATE 5 MG PO TABS: 5.0000 mg | ORAL_TABLET | Freq: Every day | ORAL | 5 refills | Status: AC

## 2018-04-18 NOTE — Progress Notes (Signed)
Subjective:     Robin Marks is a 37 y.o. female who presents for a postpartum visit. She is 4 weeks postpartum following a fourth low transverse C-Section . I have fully reviewed the prenatal and intrapartum course. The delivery was at 38.4 gestational weeks. Outcome: repeat cesarean section, low transverse incision. Anesthesia: epidural. Postpartum course has been Unremarkable. Baby's course has been Unremarkable. Baby is feeding by breast. Bleeding no bleeding. Bowel function is normal. Bladder function is normal. Patient is not sexually active. Contraception method is tubal ligation. Postpartum depression screening: negative.  The following portions of the patient's history were reviewed and updated as appropriate: allergies, current medications, past family history, past medical history, past social history, past surgical history and problem list.  Review of Systems A comprehensive review of systems was negative.   Objective:    BP (!) 143/105 (BP Location: Left Arm, Cuff Size: Large)   Pulse (!) 56   Wt 178 lb 14.4 oz (81.1 kg)   Breastfeeding? Yes   BMI 33.80 kg/m   General:  alert and no distress   Breasts:  inspection negative, no nipple discharge or bleeding, no masses or nodularity palpable  Lungs: clear to auscultation bilaterally  Heart:  regular rate and rhythm, S1, S2 normal, no murmur, click, rub or gallop  Abdomen: soft, non-tender; bowel sounds normal; no masses,  no organomegaly.  Incision is clean, dry and intact.   Vulva:  not evaluated  Vagina: not evaluated  Cervix:  not evaluated  Corpus: not examined  Adnexa:  not evaluated  Rectal Exam: Not performed.        Assessment:     1. Postpartum care following cesarean delivery  2. Postpartum hypertension Rx: - Protein / creatinine ratio, urine - CBC - AST - ALT - Creatinine, serum - Lactate dehydrogenase - triamterene-hydrochlorothiazide (DYAZIDE) 37.5-25 MG capsule; Take 1 each (1 capsule total) by  mouth daily before breakfast.  Dispense: 30 capsule; Refill: 5 - amLODipine (NORVASC) 5 MG tablet; Take 1 tablet (5 mg total) by mouth daily before breakfast.  Dispense: 30 tablet; Refill: 5  3. S/P tubal ligation  4. Patient is a currently breast-feeding mother   Plan:    1. Contraception: tubal ligation 2. Dyazide / Norvasc Rx 3. Follow up in: 1 week or as needed.     Brock Bad MD 04-18-2018

## 2018-04-19 LAB — PROTEIN / CREATININE RATIO, URINE
CREATININE, UR: 26.8 mg/dL
Protein, Ur: 10 mg/dL
Protein/Creat Ratio: 373 mg/g creat — ABNORMAL HIGH (ref 0–200)

## 2018-04-25 ENCOUNTER — Ambulatory Visit: Payer: Medicaid Other | Admitting: Obstetrics

## 2018-05-06 ENCOUNTER — Ambulatory Visit: Payer: Medicaid Other | Admitting: Obstetrics

## 2018-07-01 ENCOUNTER — Encounter: Payer: Self-pay | Admitting: Obstetrics

## 2019-10-16 ENCOUNTER — Ambulatory Visit: Payer: Medicaid Other | Attending: Family

## 2019-10-16 DIAGNOSIS — Z23 Encounter for immunization: Secondary | ICD-10-CM

## 2019-10-16 NOTE — Progress Notes (Signed)
   Covid-19 Vaccination Clinic  Name:  HYDIE LANGAN    MRN: [id number] DOB: [birth year]  10/16/2019  Ms. Sallade was observed post Covid-19 immunization for 15 minutes without incident. She was provided with Vaccine Information Sheet and instruction to access the V-Safe system.   Ms. Waldren was instructed to call 911 with any severe reactions post vaccine: Marland Kitchen Difficulty breathing  . Swelling of face and throat  . A fast heartbeat  . A bad rash all over body  . Dizziness and weakness   Immunizations Administered    Name Date Dose VIS Date Route   Moderna COVID-19 Vaccine 10/16/2019  4:09 PM 0.5 mL 06/03/2019 Intramuscular   Manufacturer: Moderna   Lot: [id number]   NDC: 49449-675-91

## 2019-11-18 ENCOUNTER — Ambulatory Visit: Payer: Medicaid Other | Attending: Family

## 2019-11-18 DIAGNOSIS — Z23 Encounter for immunization: Secondary | ICD-10-CM

## 2019-11-18 NOTE — Progress Notes (Signed)
   Covid-19 Vaccination Clinic  Name:  MCKYNNA VANLOAN    MRN: [id number] DOB: 06-18-1981  11/18/2019  Ms. Sheer was observed post Covid-19 immunization for 15 minutes without incident. She was provided with Vaccine Information Sheet and instruction to access the V-Safe system.   Ms. Walby was instructed to call 911 with any severe reactions post vaccine: Marland Kitchen Difficulty breathing  . Swelling of face and throat  . A fast heartbeat  . A bad rash all over body  . Dizziness and weakness   Immunizations Administered    Name Date Dose VIS Date Route   Moderna COVID-19 Vaccine 11/18/2019  2:12 PM 0.5 mL 06/2019 Intramuscular   Manufacturer: Moderna   Lot: [id number]   NDC: 70964-383-81

## 2020-05-10 IMAGING — US US MFM OB FOLLOW-UP
1 series · 14 of 28 positions shown · non-contrast
Comparison: none

[Series 1: us mfm ob follow-up · 44 acquisitions, 14 frames shown]
[im 2/44]
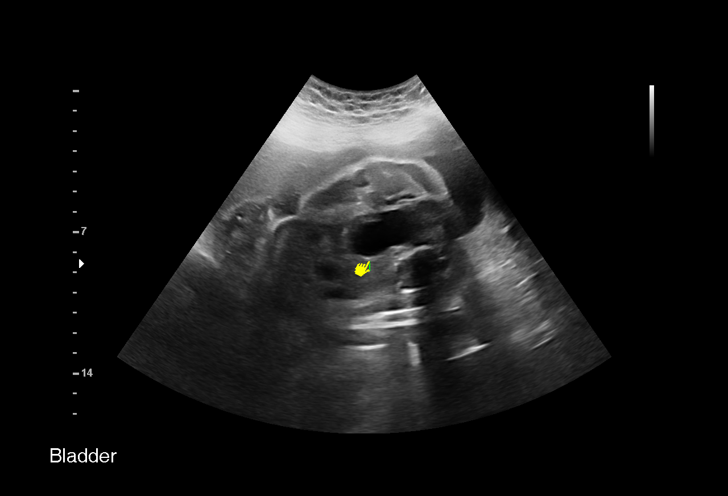
[im 5/44]
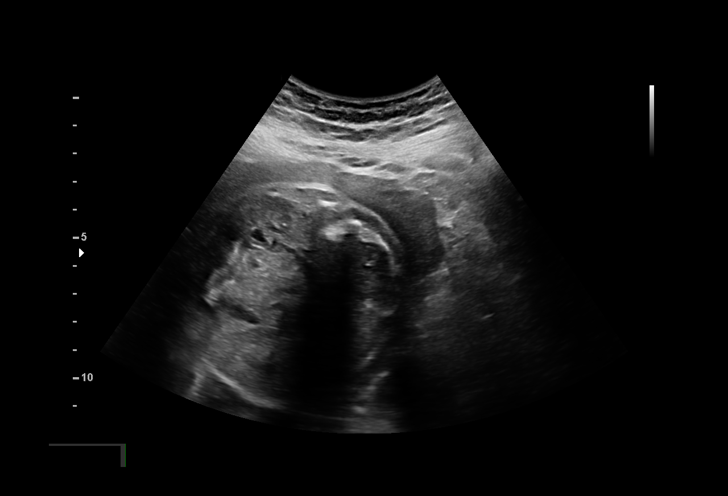
[im 8/44]
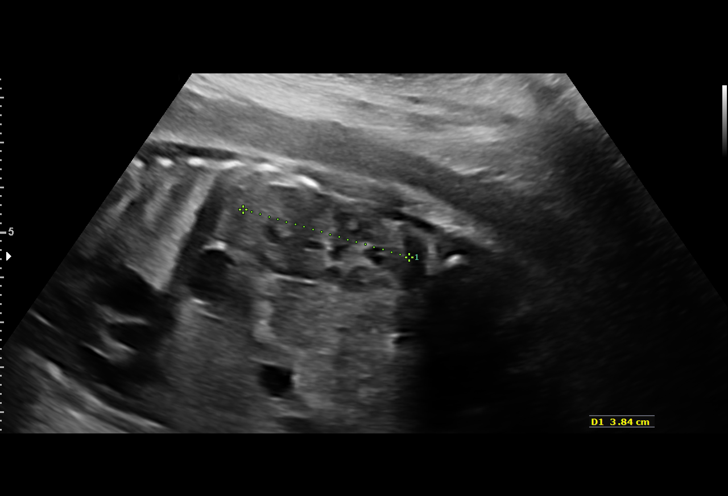
[im 12/44]
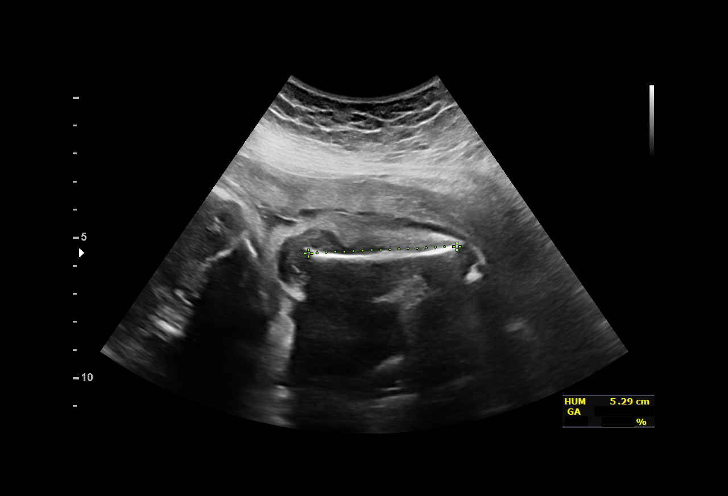
[im 15/44]
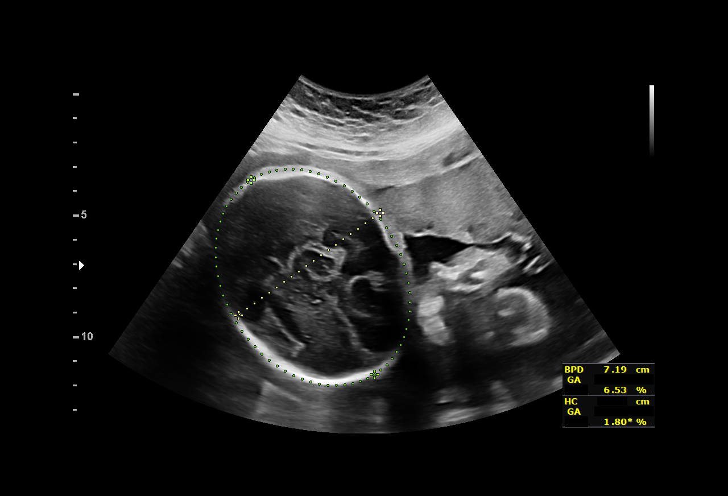
[im 18/44]
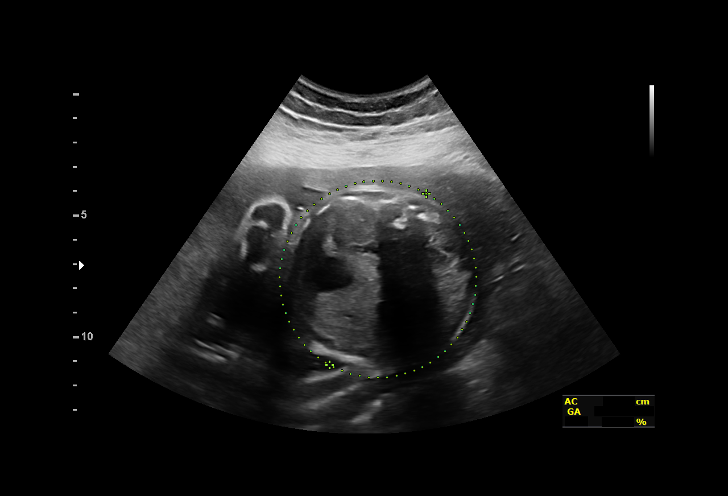
[im 21/44]
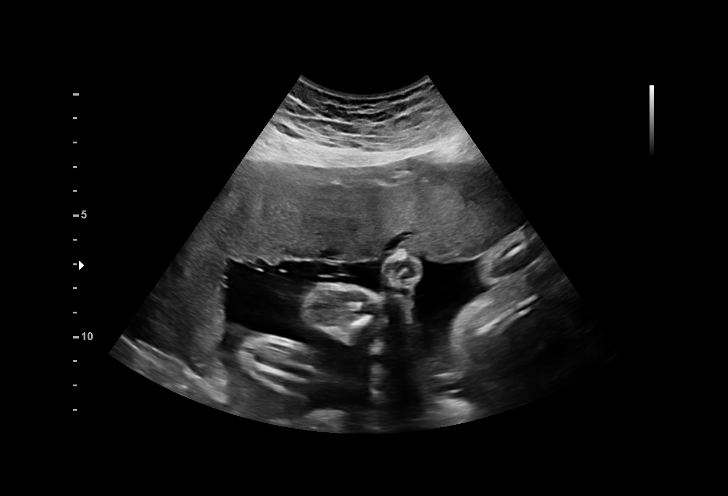
[im 24/44]
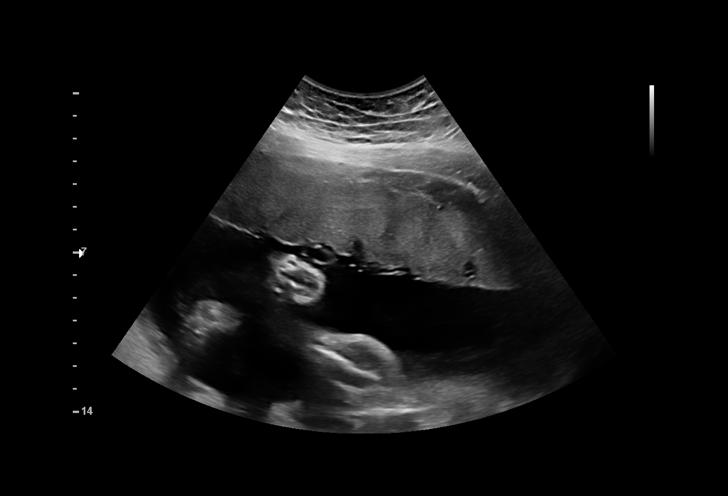
[im 28/44]
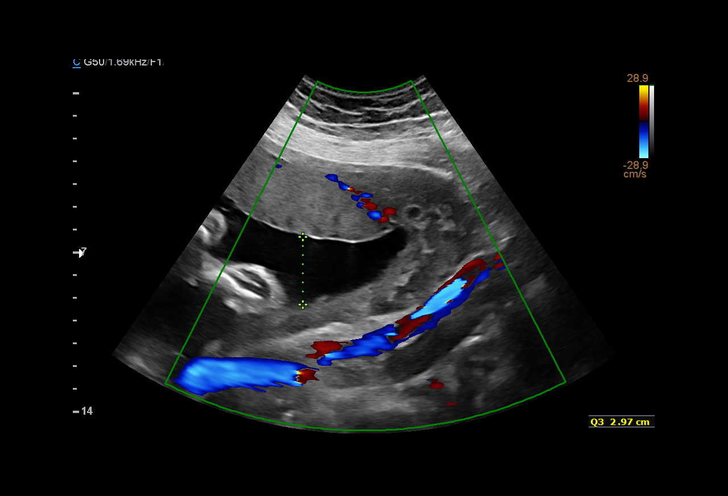
[im 31/44]
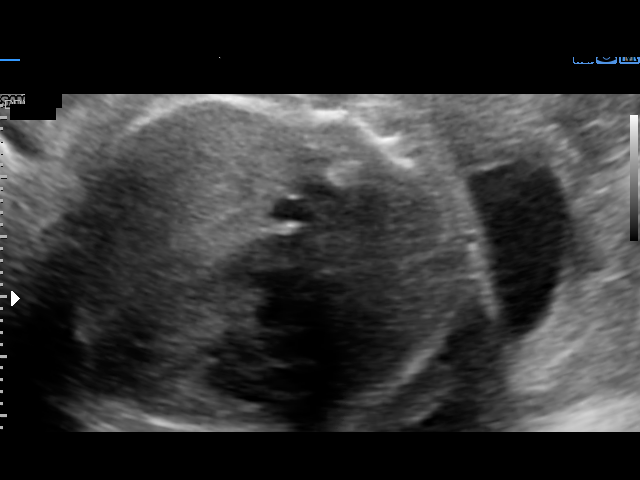
[im 34/44]
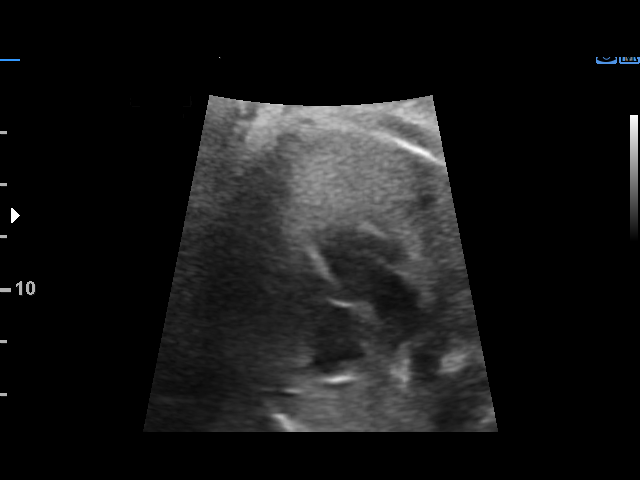
[im 37/44]
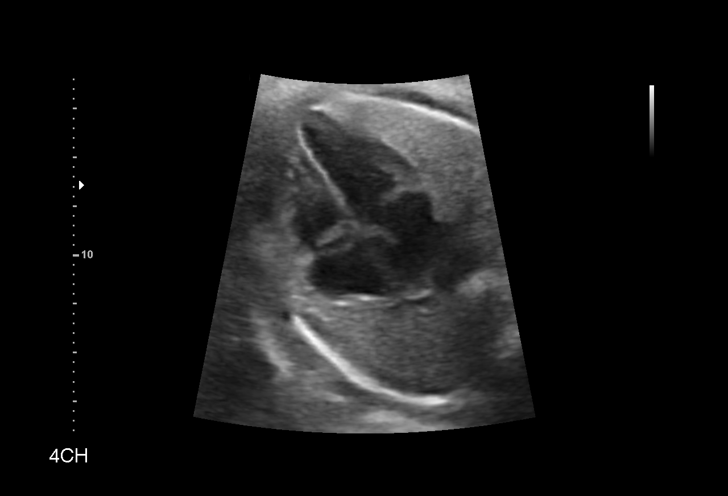
[im 40/44]
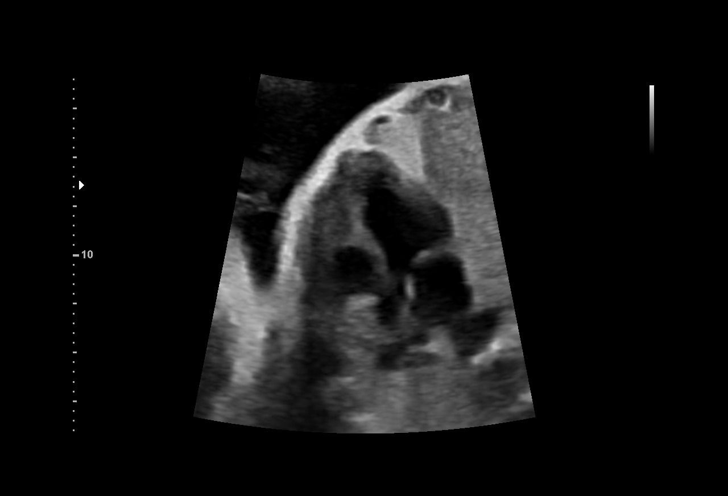
[im 44/44]
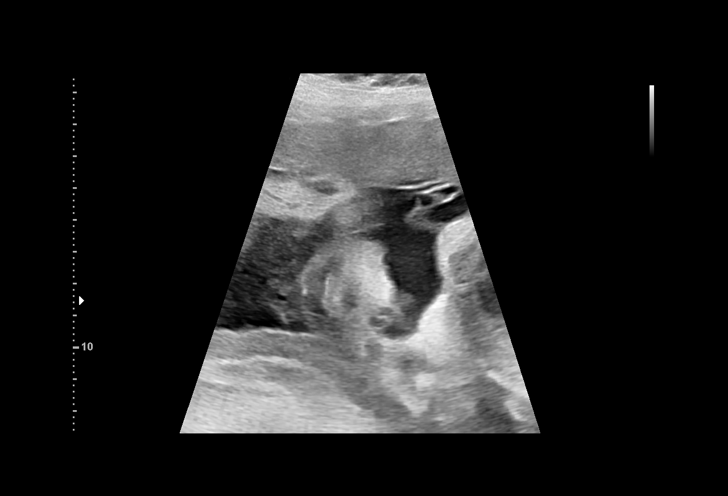

[14 of 28 positions shown; findings below may reference images not displayed]

1  [facility]             [id number]      [id number]     [id number]
Indications

30 weeks gestation of pregnancy
Obesity complicating pregnancy, second
trimester
Previous cesarean delivery, antepartum x3
Poor obstetric history: Previous preterm
delivery, antepartum (36-37)
Advanced maternal age multigravida 35+,
second trimester(Low Risk NIPS)
Late prenatal care, second trimester
Encounter for other antenatal screening
follow-up
OB History

Blood Type:            Height:  4'11"  Weight (lb):  184       BMI:
Gravidity:    4         Term:   2        Prem:   1        SAB:   0
TOP:          0       Ectopic:  0        Living: 3
Fetal Evaluation

Num Of Fetuses:     1
Fetal Heart         143
Rate(bpm):
Cardiac Activity:   Observed
Presentation:       Breech
Placenta:           Anterior
P. Cord Insertion:  Previously Visualized

Amniotic Fluid
AFI FV:      Subjectively within normal limits

AFI Sum(cm)     %Tile       Largest Pocket(cm)
13.89           45
RUQ(cm)       RLQ(cm)       LUQ(cm)        LLQ(cm)
4.72
Biometry

BPD:      72.1  mm     G. Age:  29w 0d          7  %    CI:        73.41   %    70 - 86
FL/HC:      22.1   %    19.2 -
HC:      267.4  mm     G. Age:  29w 1d        < 3  %    HC/AC:      1.06        0.99 -
AC:      251.4  mm     G. Age:  29w 2d         20  %    FL/BPD:     81.8   %    71 - 87
FL:         59  mm     G. Age:  30w 6d         50  %    FL/AC:      23.5   %    20 - 24
HUM:      53.3  mm     G. Age:  31w 0d         67  %

Est. FW:    9139  gm      3 lb 3 oz     43  %
Gestational Age

LMP:           27w 5d        Date:  07/05/17                 EDD:   04/11/18
U/S Today:     29w 4d                                        EDD:   03/29/18
Best:          30w 2d     Det. By:  U/S  (12/17/17)          EDD:   03/24/18
Anatomy

Cranium:               Appears normal         Aortic Arch:            Appears normal
Cavum:                 Previously seen        Ductal Arch:            Not well visualized
Ventricles:            Previously seen        Diaphragm:              Previously seen
Choroid Plexus:        Previously seen        Stomach:                Appears normal, left
sided
Cerebellum:            Previously seen        Abdomen:                Previously seen
Posterior Fossa:       Previously seen        Abdominal Wall:         Previously seen
Nuchal Fold:           Not applicable (>20    Cord Vessels:           Previously seen
wks GA)
Face:                  Appears normal         Kidneys:                Appear normal
(orbits and profile)
Lips:                  Appears normal         Bladder:                Appears normal
Thoracic:              Appears normal         Spine:                  Previously seen
Heart:                 Appears normal         Upper Extremities:      Previously seen
(4CH, axis, and situs
RVOT:                  Not well visualized    Lower Extremities:      Previously seen
(normal 3VV)
LVOT:                  Appears normal

Other:  Female gender prev seen. Heels prev visualized. Technically difficult
due to maternal habitus and fetal position.
Cervix Uterus Adnexa

Cervix
Not visualized (advanced GA >69wks)
Impression

Patient returned for completion of fetal anatomy. Amniotic
fluid is normal and good fetal activity is seen. Fetal growth is
appropriate for gestational age.

Fetal face and cardiac anatomy appear normal.

Maternal obesity imposes limitations on the resolution of
images, and failure to detect fetal anomalies is more common
in obese pregnant women. As maternal obesity makes
clinical assessment of fetal growth difficult, we recommend a
follow-up  growth scan.
Recommendations

An appointment was made for her to return in 4 weeks for
fetal growth assessment.

## 2020-06-21 IMAGING — US US MFM OB FOLLOW-UP
1 series · 14 of 28 positions shown · non-contrast
Comparison: none

[Series 1: us mfm ob follow-up · 14 of 28 slices shown]
[im 2/28]
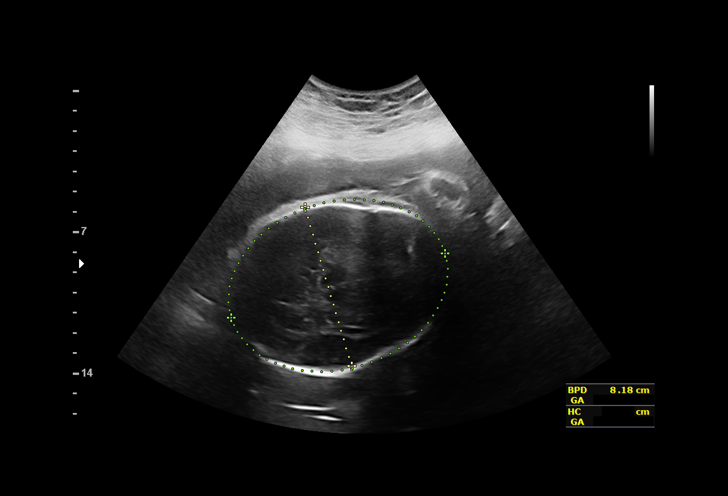
[im 4/28]
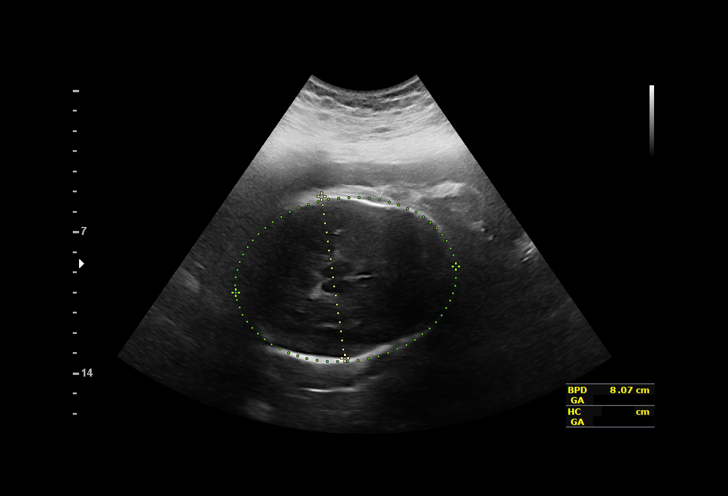
[im 6/28]
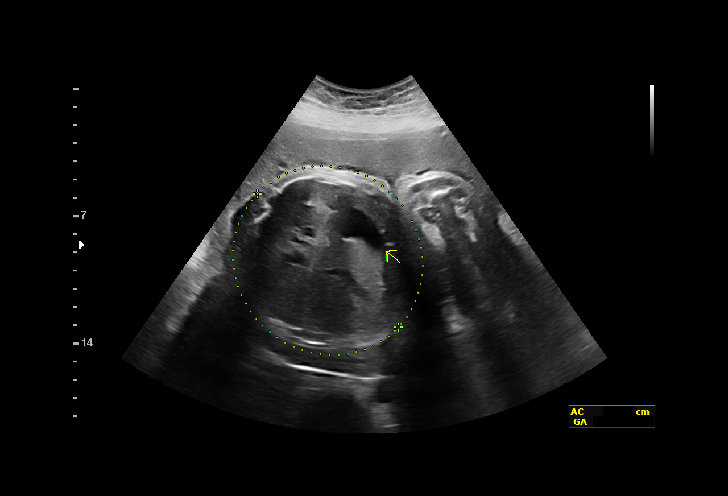
[im 8/28]
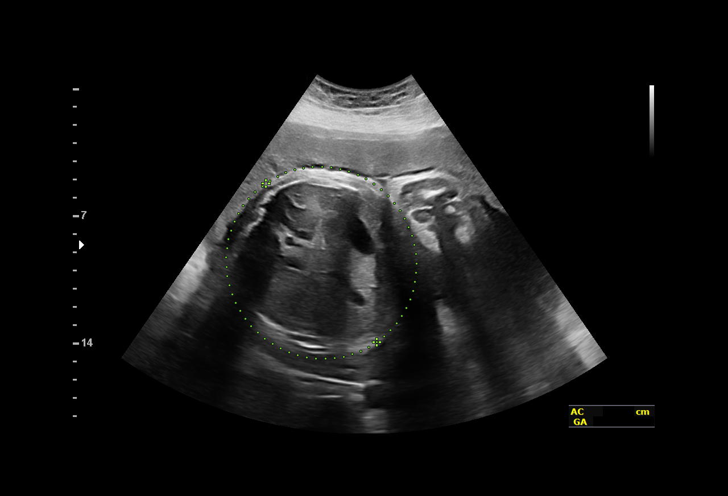
[im 10/28]
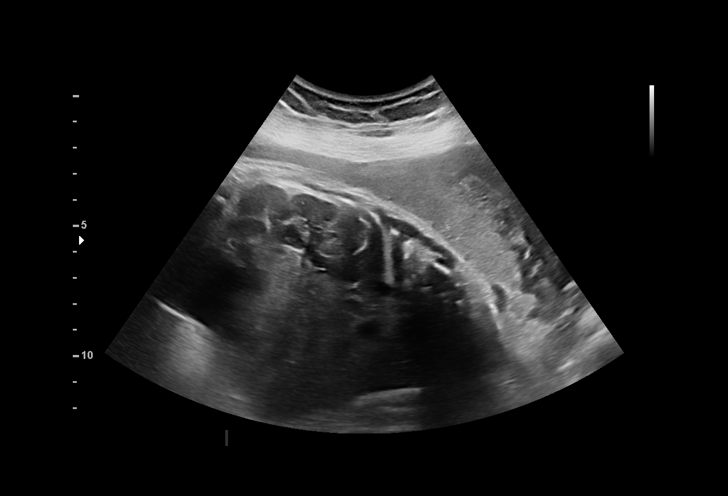
[im 12/28]
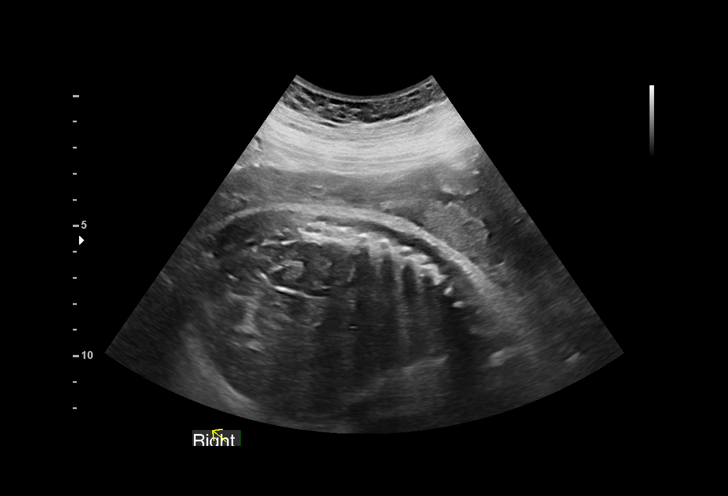
[im 14/28]
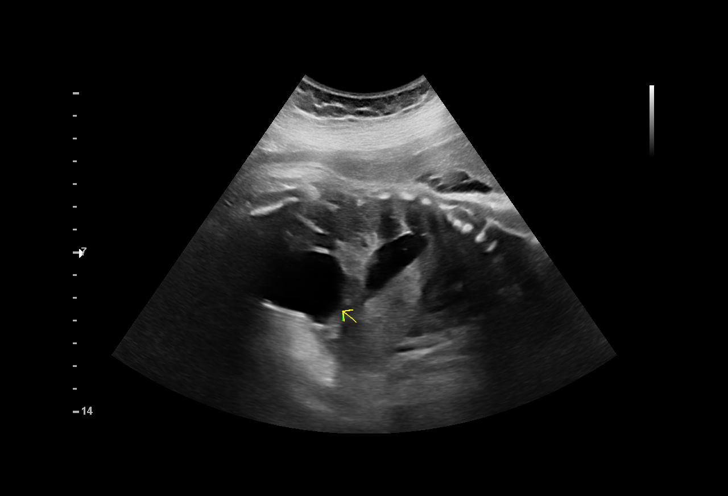
[im 16/28]
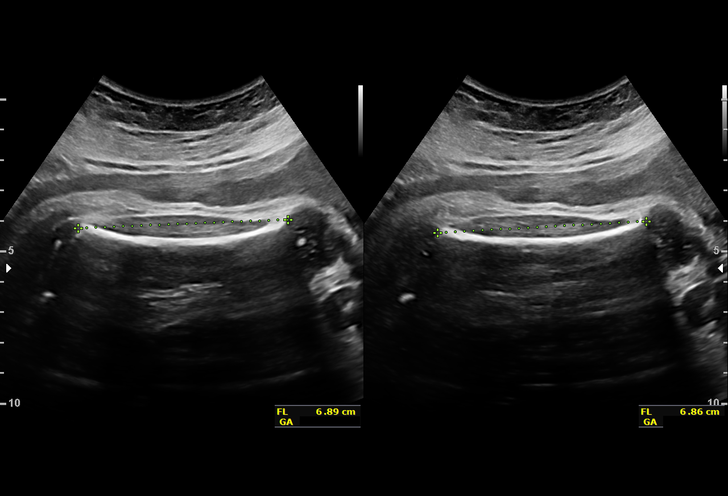
[im 18/28]
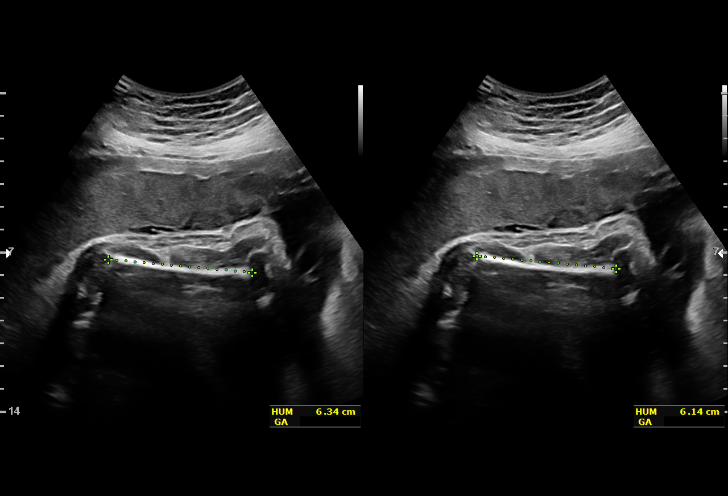
[im 20/28]
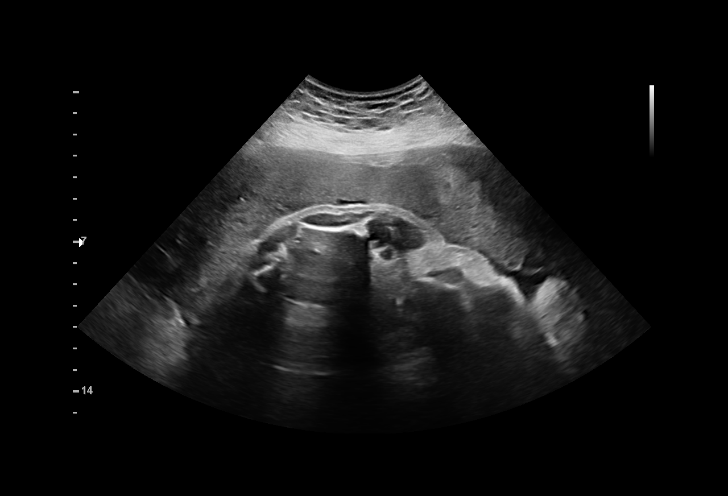
[im 22/28]
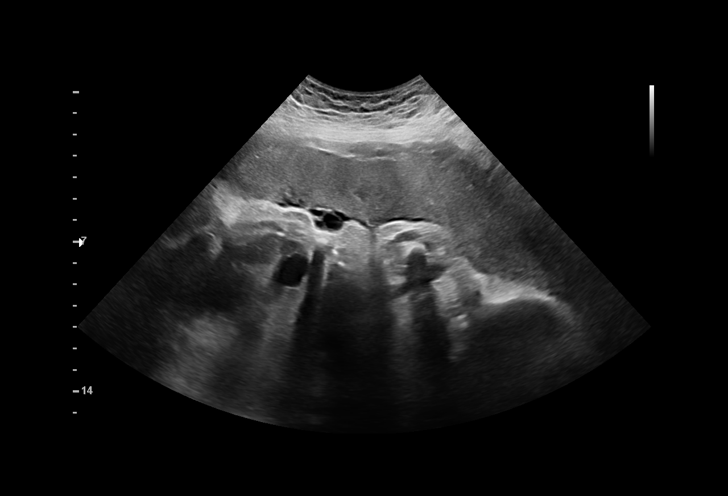
[im 24/28]
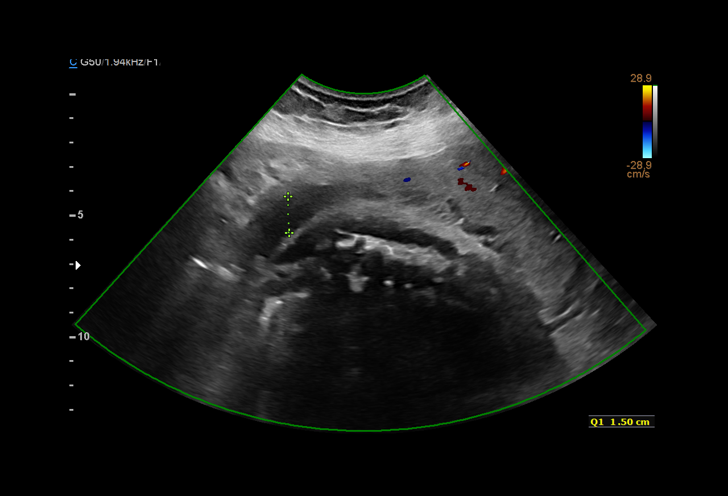
[im 26/28]
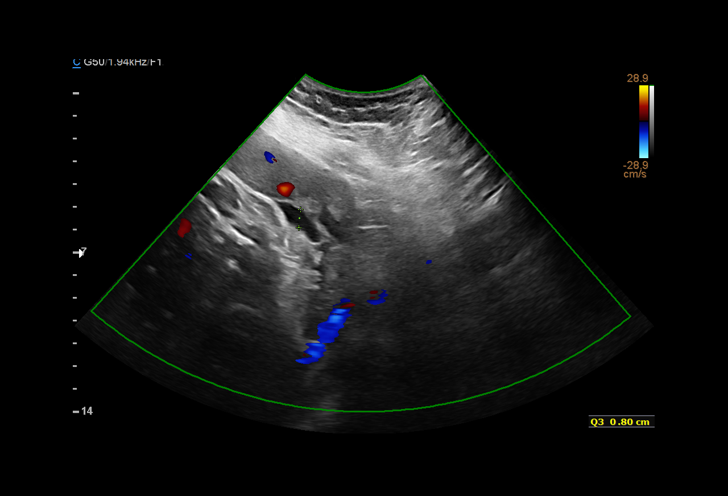
[im 28/28]
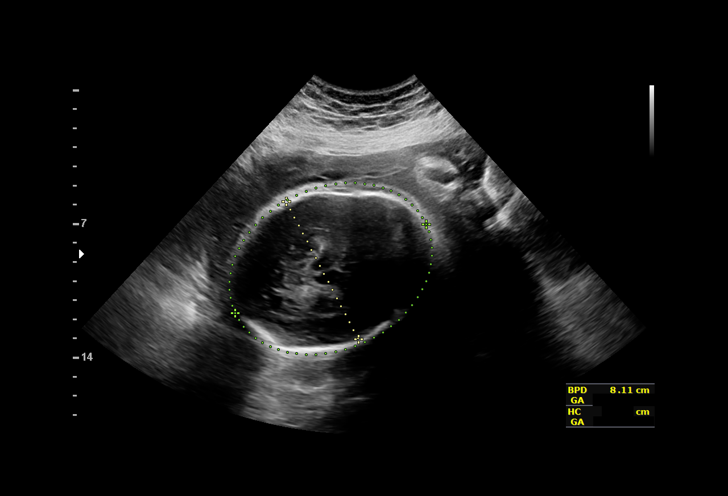

[14 of 28 positions shown; findings below may reference images not displayed]

Indications

36 weeks gestation of pregnancy
Obesity complicating pregnancy, third
trimester
Previous cesarean delivery, antepartum x3
Poor obstetric history: Previous preterm
delivery, antepartum (36-37)
Advanced maternal age multigravida 35+,
third trimester (low risk NIPS)
Late to prenatal care, third trimester
Encounter for other antenatal screening
follow-up
Vital Signs

BMI:
Fetal Evaluation

Num Of Fetuses:         1
Fetal Heart Rate(bpm):  157
Cardiac Activity:       Observed
Presentation:           Cephalic
Placenta:               Anterior
P. Cord Insertion:      Previously Visualized

Amniotic Fluid
AFI FV:      Within normal limits

AFI Sum(cm)     %Tile       Largest Pocket(cm)
8.34            10
RUQ(cm)       RLQ(cm)       LUQ(cm)        LLQ(cm)
1.5
Biometry

BPD:      80.7  mm     G. Age:  32w 3d        < 1  %    CI:        70.62   %    70 - 86
FL/HC:      22.4   %    20.1 -
HC:      306.1  mm     G. Age:  34w 1d        < 3  %    HC/AC:      0.93        0.93 -
AC:      328.5  mm     G. Age:  36w 5d         74  %    FL/BPD:     85.0   %    71 - 87
FL:       68.6  mm     G. Age:  35w 1d         22  %    FL/AC:      20.9   %    20 - 24
HUM:      63.3  mm     G. Age:  36w 5d         73  %

Est. FW:    3199  gm           6 lb     50  %
OB History

Gravidity:    4         Term:   2        Prem:   1        SAB:   0
TOP:          0       Ectopic:  0        Living: 3
Gestational Age

LMP:           33w 5d        Date:  07/05/17                 EDD:   04/11/18
U/S Today:     34w 4d                                        EDD:   04/05/18
Best:          36w 2d     Det. By:  U/S  (12/17/17)          EDD:   03/24/18
Anatomy

Cranium:               Appears normal         Aortic Arch:            Previously seen
Cavum:                 Previously seen        Ductal Arch:            Not well visualized
Ventricles:            Previously seen        Diaphragm:              Previously seen
Choroid Plexus:        Previously seen        Stomach:                Appears normal, left
sided
Cerebellum:            Previously seen        Abdomen:                Previously seen
Posterior Fossa:       Previously seen        Abdominal Wall:         Previously seen
Nuchal Fold:           Not applicable (>20    Cord Vessels:           Previously seen
wks GA)
Face:                  Orbits and profile     Kidneys:                Appear normal
previously seen
Lips:                  Previously seen        Bladder:                Appears normal
Thoracic:              Appears normal         Spine:                  Previously seen
Heart:                 Previously seen        Upper Extremities:      Previously seen
RVOT:                  Not well visualized    Lower Extremities:      Previously seen
(nl 3VV prev seen)
LVOT:                  Previously seen

Other:  Female gender prev seen. Heels prev visualized. Technically difficult
due to maternal habitus and fetal position.
Cervix Uterus Adnexa

Cervix
Not visualized (advanced GA >47wks)
Impression

Amniotic fluid is normal and good fetal activity is seen. Fetal
growth is appropriate for gestational age.
Recommendations

Follow-up scans as clinically indicated.
